# Patient Record
Sex: Male | Born: 1940 | Race: White | Hispanic: No | Marital: Married | Smoking: Former smoker
Health system: Southern US, Community
[De-identification: ages and names within clinical notes are randomized; demographics above are authoritative.]

## PROBLEM LIST (undated history)

## (undated) DIAGNOSIS — T7840XA Allergy, unspecified, initial encounter: Secondary | ICD-10-CM

## (undated) DIAGNOSIS — I10 Essential (primary) hypertension: Secondary | ICD-10-CM

## (undated) DIAGNOSIS — E785 Hyperlipidemia, unspecified: Secondary | ICD-10-CM

## (undated) DIAGNOSIS — I2510 Atherosclerotic heart disease of native coronary artery without angina pectoris: Secondary | ICD-10-CM

## (undated) DIAGNOSIS — K219 Gastro-esophageal reflux disease without esophagitis: Secondary | ICD-10-CM

## (undated) DIAGNOSIS — I509 Heart failure, unspecified: Secondary | ICD-10-CM

## (undated) DIAGNOSIS — E119 Type 2 diabetes mellitus without complications: Secondary | ICD-10-CM

## (undated) DIAGNOSIS — M1990 Unspecified osteoarthritis, unspecified site: Secondary | ICD-10-CM

## (undated) HISTORY — DX: Hyperlipidemia, unspecified: E78.5

## (undated) HISTORY — DX: Type 2 diabetes mellitus without complications: E11.9

## (undated) HISTORY — DX: Essential (primary) hypertension: I10

## (undated) HISTORY — DX: Heart failure, unspecified: I50.9

## (undated) HISTORY — DX: Atherosclerotic heart disease of native coronary artery without angina pectoris: I25.10

## (undated) HISTORY — PX: SPINE SURGERY: SHX786

## (undated) HISTORY — PX: JOINT REPLACEMENT: SHX530

## (undated) HISTORY — DX: Unspecified osteoarthritis, unspecified site: M19.90

## (undated) HISTORY — DX: Allergy, unspecified, initial encounter: T78.40XA

## (undated) HISTORY — DX: Gastro-esophageal reflux disease without esophagitis: K21.9

---

## 1997-12-27 ENCOUNTER — Ambulatory Visit (HOSPITAL_COMMUNITY): Admission: RE | Admit: 1997-12-27 | Discharge: 1997-12-27 | Payer: Self-pay | Admitting: Cardiology

## 1998-01-13 ENCOUNTER — Ambulatory Visit (HOSPITAL_COMMUNITY): Admission: RE | Admit: 1998-01-13 | Discharge: 1998-01-13 | Payer: Self-pay | Admitting: Cardiology

## 1998-01-23 ENCOUNTER — Ambulatory Visit (HOSPITAL_COMMUNITY): Admission: RE | Admit: 1998-01-23 | Discharge: 1998-01-23 | Payer: Self-pay | Admitting: Cardiology

## 1998-01-23 ENCOUNTER — Encounter: Admission: RE | Admit: 1998-01-23 | Discharge: 1998-04-23 | Payer: Self-pay | Admitting: Cardiology

## 1999-11-26 ENCOUNTER — Encounter: Payer: Self-pay | Admitting: Emergency Medicine

## 1999-11-26 ENCOUNTER — Ambulatory Visit (HOSPITAL_COMMUNITY): Admission: RE | Admit: 1999-11-26 | Discharge: 1999-11-26 | Payer: Self-pay | Admitting: Emergency Medicine

## 1999-12-21 ENCOUNTER — Encounter: Payer: Self-pay | Admitting: Neurosurgery

## 1999-12-21 ENCOUNTER — Ambulatory Visit (HOSPITAL_COMMUNITY): Admission: RE | Admit: 1999-12-21 | Discharge: 1999-12-21 | Payer: Self-pay | Admitting: Neurosurgery

## 2000-01-05 ENCOUNTER — Ambulatory Visit (HOSPITAL_COMMUNITY): Admission: RE | Admit: 2000-01-05 | Discharge: 2000-01-05 | Payer: Self-pay | Admitting: Neurosurgery

## 2000-01-05 ENCOUNTER — Encounter: Payer: Self-pay | Admitting: Neurosurgery

## 2000-01-18 ENCOUNTER — Ambulatory Visit (HOSPITAL_COMMUNITY): Admission: RE | Admit: 2000-01-18 | Discharge: 2000-01-18 | Payer: Self-pay | Admitting: Neurosurgery

## 2000-01-18 ENCOUNTER — Encounter: Payer: Self-pay | Admitting: Neurosurgery

## 2000-03-17 ENCOUNTER — Encounter: Admission: RE | Admit: 2000-03-17 | Discharge: 2000-03-17 | Payer: Self-pay | Admitting: Neurosurgery

## 2000-03-17 ENCOUNTER — Encounter: Payer: Self-pay | Admitting: Neurosurgery

## 2000-04-18 ENCOUNTER — Encounter: Admission: RE | Admit: 2000-04-18 | Discharge: 2000-04-18 | Payer: Self-pay | Admitting: Orthopaedic Surgery

## 2000-04-18 ENCOUNTER — Encounter: Payer: Self-pay | Admitting: Orthopaedic Surgery

## 2000-06-28 HISTORY — PX: OTHER SURGICAL HISTORY: SHX169

## 2001-03-08 ENCOUNTER — Ambulatory Visit (HOSPITAL_COMMUNITY): Admission: RE | Admit: 2001-03-08 | Discharge: 2001-03-09 | Payer: Self-pay | Admitting: Cardiology

## 2001-03-08 ENCOUNTER — Encounter: Payer: Self-pay | Admitting: Cardiology

## 2001-03-10 ENCOUNTER — Encounter: Payer: Self-pay | Admitting: Cardiology

## 2001-03-10 ENCOUNTER — Encounter: Admission: RE | Admit: 2001-03-10 | Discharge: 2001-03-10 | Payer: Self-pay | Admitting: Cardiology

## 2002-01-03 ENCOUNTER — Encounter: Admission: RE | Admit: 2002-01-03 | Discharge: 2002-01-03 | Payer: Self-pay | Admitting: Cardiology

## 2002-01-03 ENCOUNTER — Encounter: Payer: Self-pay | Admitting: Cardiology

## 2003-04-04 ENCOUNTER — Encounter: Payer: Self-pay | Admitting: Neurosurgery

## 2003-04-04 ENCOUNTER — Encounter: Admission: RE | Admit: 2003-04-04 | Discharge: 2003-04-04 | Payer: Self-pay | Admitting: Neurosurgery

## 2003-04-25 ENCOUNTER — Encounter: Admission: RE | Admit: 2003-04-25 | Discharge: 2003-04-25 | Payer: Self-pay | Admitting: Neurosurgery

## 2003-05-14 ENCOUNTER — Encounter: Admission: RE | Admit: 2003-05-14 | Discharge: 2003-05-14 | Payer: Self-pay | Admitting: Neurosurgery

## 2004-02-05 ENCOUNTER — Encounter: Admission: RE | Admit: 2004-02-05 | Discharge: 2004-02-05 | Payer: Self-pay | Admitting: Neurosurgery

## 2004-02-11 ENCOUNTER — Observation Stay (HOSPITAL_COMMUNITY): Admission: RE | Admit: 2004-02-11 | Discharge: 2004-02-11 | Payer: Self-pay | Admitting: Neurosurgery

## 2004-02-20 ENCOUNTER — Inpatient Hospital Stay (HOSPITAL_COMMUNITY): Admission: RE | Admit: 2004-02-20 | Discharge: 2004-02-25 | Payer: Self-pay | Admitting: Neurosurgery

## 2006-07-06 ENCOUNTER — Inpatient Hospital Stay (HOSPITAL_COMMUNITY): Admission: RE | Admit: 2006-07-06 | Discharge: 2006-07-09 | Payer: Self-pay | Admitting: Orthopedic Surgery

## 2006-07-30 ENCOUNTER — Ambulatory Visit (HOSPITAL_COMMUNITY): Admission: RE | Admit: 2006-07-30 | Discharge: 2006-07-30 | Payer: Self-pay | Admitting: Family Medicine

## 2006-07-30 ENCOUNTER — Ambulatory Visit: Payer: Self-pay | Admitting: Vascular Surgery

## 2009-01-06 ENCOUNTER — Inpatient Hospital Stay (HOSPITAL_COMMUNITY): Admission: RE | Admit: 2009-01-06 | Discharge: 2009-01-10 | Payer: Self-pay | Admitting: Orthopedic Surgery

## 2010-10-04 LAB — BASIC METABOLIC PANEL
CO2: 32 mEq/L (ref 19–32)
Calcium: 8.6 mg/dL (ref 8.4–10.5)
Calcium: 8.6 mg/dL (ref 8.4–10.5)
Chloride: 100 mEq/L (ref 96–112)
Chloride: 100 mEq/L (ref 96–112)
Chloride: 103 mEq/L (ref 96–112)
Creatinine, Ser: 0.97 mg/dL (ref 0.4–1.5)
Creatinine, Ser: 0.99 mg/dL (ref 0.4–1.5)
GFR calc Af Amer: >60 mL/min (ref >60)
GFR calc Af Amer: >60 mL/min (ref >60)
GFR calc non Af Amer: >60 mL/min (ref >60)
GFR calc non Af Amer: >60 mL/min (ref >60)
Glucose, Bld: 182 mg/dL — ABNORMAL HIGH (ref 70–99)
Potassium: 3.2 mEq/L — ABNORMAL LOW (ref 3.5–5.1)
Sodium: 141 mEq/L (ref 135–145)

## 2010-10-04 LAB — GLUCOSE, CAPILLARY
Glucose-Capillary: 224 mg/dL — ABNORMAL HIGH (ref 70–99)
Glucose-Capillary: 238 mg/dL — ABNORMAL HIGH (ref 70–99)
Glucose-Capillary: 150 mg/dL — ABNORMAL HIGH (ref 70–99)
Glucose-Capillary: 151 mg/dL — ABNORMAL HIGH (ref 70–99)
Glucose-Capillary: 160 mg/dL — ABNORMAL HIGH (ref 70–99)
Glucose-Capillary: 191 mg/dL — ABNORMAL HIGH (ref 70–99)
Glucose-Capillary: 191 mg/dL — ABNORMAL HIGH (ref 70–99)
Glucose-Capillary: 203 mg/dL — ABNORMAL HIGH (ref 70–99)
Glucose-Capillary: 203 mg/dL — ABNORMAL HIGH (ref 70–99)
Glucose-Capillary: 208 mg/dL — ABNORMAL HIGH (ref 70–99)
Glucose-Capillary: 244 mg/dL — ABNORMAL HIGH (ref 70–99)

## 2010-10-04 LAB — URINALYSIS, ROUTINE W REFLEX MICROSCOPIC
Bilirubin Urine: NEGATIVE
Glucose, UA: 250 mg/dL — AB
Hgb urine dipstick: NEGATIVE
Ketones, ur: NEGATIVE mg/dL
Nitrite: NEGATIVE
Protein, ur: NEGATIVE mg/dL
Specific Gravity, Urine: 1.005 (ref 1.005–1.030)
Urobilinogen, UA: 1 mg/dL (ref 0.0–1.0)
pH: 8 (ref 5.0–8.0)

## 2010-10-04 LAB — COMPREHENSIVE METABOLIC PANEL
ALT: 56 U/L — ABNORMAL HIGH (ref 0–53)
AST: 54 U/L — ABNORMAL HIGH (ref 0–37)
Albumin: 4.3 g/dL (ref 3.5–5.2)
Alkaline Phosphatase: 49 U/L (ref 39–117)
BUN: 10 mg/dL (ref 6–23)
CO2: 31 mEq/L (ref 19–32)
Calcium: 10 mg/dL (ref 8.4–10.5)
Chloride: 95 mEq/L — ABNORMAL LOW (ref 96–112)
Creatinine, Ser: 1 mg/dL (ref 0.4–1.5)
GFR calc Af Amer: >60 mL/min (ref >60)
GFR calc non Af Amer: >60 mL/min (ref >60)
Glucose, Bld: 259 mg/dL — ABNORMAL HIGH (ref 70–99)
Potassium: 3.3 mEq/L — ABNORMAL LOW (ref 3.5–5.1)
Sodium: 139 mEq/L (ref 135–145)
Total Bilirubin: 1.6 mg/dL — ABNORMAL HIGH (ref 0.3–1.2)
Total Protein: 7.3 g/dL (ref 6.0–8.3)

## 2010-10-04 LAB — CBC
HCT: 28.2 % — ABNORMAL LOW (ref 39.0–52.0)
HCT: 29.6 % — ABNORMAL LOW (ref 39.0–52.0)
HCT: 44 % (ref 39.0–52.0)
Hemoglobin: 14.9 g/dL (ref 13.0–17.0)
Hemoglobin: 9.7 g/dL — ABNORMAL LOW (ref 13.0–17.0)
MCHC: 33.9 g/dL (ref 30.0–36.0)
MCHC: 34.3 g/dL (ref 30.0–36.0)
MCV: 81.7 fL (ref 78.0–100.0)
MCV: 82.4 fL (ref 78.0–100.0)
MCV: 82.8 fL (ref 78.0–100.0)
Platelets: 277 10*3/uL (ref 150–400)
RBC: 3.45 MIL/uL — ABNORMAL LOW (ref 4.22–5.81)
RBC: 3.6 MIL/uL — ABNORMAL LOW (ref 4.22–5.81)
RBC: 3.78 MIL/uL — ABNORMAL LOW (ref 4.22–5.81)
RBC: 5.32 MIL/uL (ref 4.22–5.81)
RDW: 17.7 % — ABNORMAL HIGH (ref 11.5–15.5)
RDW: 18 % — ABNORMAL HIGH (ref 11.5–15.5)
WBC: 7.1 10*3/uL (ref 4.0–10.5)
WBC: 7.2 10*3/uL (ref 4.0–10.5)
WBC: 7.2 10*3/uL (ref 4.0–10.5)

## 2010-10-04 LAB — PROTIME-INR
INR: 2.9 — ABNORMAL HIGH (ref 0.00–1.49)
INR: 1.1 (ref 0.00–1.49)
INR: 1.2 (ref 0.00–1.49)
INR: 2.8 — ABNORMAL HIGH (ref 0.00–1.49)
Prothrombin Time: 32.6 seconds — ABNORMAL HIGH (ref 11.6–15.2)
Prothrombin Time: 14.3 seconds (ref 11.6–15.2)
Prothrombin Time: 15.2 seconds (ref 11.6–15.2)
Prothrombin Time: 23.3 seconds — ABNORMAL HIGH (ref 11.6–15.2)

## 2010-10-04 LAB — APTT: aPTT: 37 seconds (ref 24–37)

## 2010-10-04 LAB — TYPE AND SCREEN: Antibody Screen: NEGATIVE

## 2010-11-10 NOTE — H&P (Signed)
[name], [name]               ACCOUNT NO.:  [id number]   MEDICAL RECORD NO.:  [id number]          PATIENT TYPE:  INP   LOCATION:  NA                           FACILITY:  [facility]   PHYSICIAN:  Ollen Gross, M.D.    DATE OF BIRTH:  June 08, 1941   DATE OF ADMISSION:  DATE OF DISCHARGE:                              HISTORY & PHYSICAL   CHIEF COMPLAINT:  Right hip pain.   HISTORY OF PRESENT ILLNESS:  The patient is a 70 year old male who has  seen by Dr. Lequita Halt for ongoing right hip pain.  He has had a previous  left total hip done in January 2008. Left hip is doing well. Right hip  continues to be problematic. He has reached a point where he would like  to have something done about it. He has end-stage arthritis.  Risks and  benefits have been discussed.  He elects to proceed with surgery.   ALLERGIES:  PENICILLIN stopped his breathing.   CURRENT MEDICATIONS:  Toprol, Norvasc, potassium, metformin, Protonix,  Crestor, Lasix, Januvia, CoQ10, Aleve, occasional Vicodin.   PAST MEDICAL HISTORY:  Hypertension, coronary arterial disease,  hypercholesterolemia, reflux disease.  He has had cardiac  catheterization with coronary stenting, non-insulin-dependent diabetes  mellitus.  Please note he has had recent stress test.   SURGICAL HISTORY:  Cardiac catheterization with coronary stenting prior  to previous hip surgery. And then he had a coronary stenting back in  2002. Lumbar fusion L4-5 in August 2005, and left total replacement in  January 2008.   SOCIAL HISTORY:  Married, 3 children.  Quit smoking about 12 years ago.  Truck driver. Less than 1 drink of alcohol per week.  He does want to  look into a skilled rehab facility.  Lives in [place] home.  Does have a  living will.   FAMILY HISTORY:  Mother with history of breast cancer, diabetes, and  brain tumor.  Father history unknown.   REVIEW OF SYSTEMS:  GENERAL:  No fevers, chills, or night sweats.  NEUROLOGICAL: No seizures,  syncope, or paralysis.  RESPIRATORY: No  shortness breath, productive cough, or hemoptysis.  CARDIOVASCULAR:  No  chest pain, angina, or orthopnea.  GI: No nausea, vomiting, diarrhea, or  constipation.  GU: No dysuria, hematuria, or discharge.  MUSCULOSKELETAL:  Hip pain.   PHYSICAL EXAMINATION:  VITAL SIGNS:  Pulse 74, respirations 12, blood  pressure 128/72.  GENERAL: A 70 year old white male well-nourished, well-developed,  overweight, no acute distress, alert and cooperative.  Good historian.  HEENT: Normocephalic, atraumatic.  Pupils are round and reactive.  EOMs  intact.  NECK: Supple.  CHEST: Clear.  HEART: Regular rate and rhythm.  No murmur, S1-S2 noted.  ABDOMEN: Soft, round, slightly protuberant.  ABDOMEN:  Bowel sounds present.  RECTAL, BREAST, GENITALIA:  Not done, not pertinent to present illness.  EXTREMITIES:  Right hip flexion 90-0 internal rotation, 20 degrees  external rotation, 30 degrees abduction.   IMPRESSION:  Osteoarthritis, right hip.   PLAN:  The patient was admitted to [facility] to undergo a  right total hip replacement arthroplasty.  Surgery  will be performed by  Dr. Ollen Gross. The patient does want to look into a skilled rehab  facility since his wife is unable to care for him at this time.      [name], P.A.C.      Ollen Gross, M.D.  Electronically Signed    ALP/MEDQ  D:  01/05/2009  T:  01/05/2009  Job:  [id number]   cc:   [name], M.D.  Fax: [phone]   [name], M.D.  Fax: [phone]   Ollen Gross, M.D.  Fax: [phone]

## 2010-11-10 NOTE — Discharge Summary (Signed)
[name], [name]               ACCOUNT NO.:  [id number]   MEDICAL RECORD NO.:  [id number]          PATIENT TYPE:  INP   LOCATION:  1608                         FACILITY:  [facility]   PHYSICIAN:  Ollen Gross, M.D.    DATE OF BIRTH:  Jan 12, 1941   DATE OF ADMISSION:  01/06/2009  DATE OF DISCHARGE:  01/09/2009                               DISCHARGE SUMMARY   ADMISSION DIAGNOSES:  1. Osteoarthritis right hip.  2. Hypertension.  3. Coronary arterial disease.  4. Hypercholesterolemia.  5. Reflux disease.  6. Non-insulin-dependent diabetes mellitus.  7. Status post cardiac catheterization with coronary stenting.   DISCHARGE DIAGNOSES:  1. Osteoarthritis right hip status post right total hip replacement      arthroplasty.  2. Mild postop blood loss anemia did not require transfusion.  3. Postop hypokalemia, slowly improving.  4. Hypertension.  5. Reflux disease.  6. Non-insulin dependent diabetes mellitus.  7. Status post cardiac catheterization with coronary stenting.   PROCEDURE:  January 06, 2009 right total hip.  Surgeon, Dr. Lequita Halt,  assistant, Greggory Brandy.   ANESTHESIA:  General.   CONSULTS:  None.   BRIEF HISTORY:  [name] is a 70 year old male with end-stage  arthritis of the right hip, progressive worsening pain dysfunction,  failed non-operative management, now presents for total hip  arthroplasty.   LABORATORY DATA:  Preop CBC showed hemoglobin of 14.9, hematocrit of 44,  white cell count 7.1, platelets 277.  PT/INR 14.3, 1.1 with PTT of 37.  Chem panel on admission did show a little low potassium on preop level  of 3.3, chloride 95, glucose was elevated 259, known diabetic, did have  a slight elevation AST of 54, ALT of 56.  Preop UA was negative with  exception of positive glucose.  Serial CBCs were followed.  Hemoglobin  dropped 10.6 and 10, last noted 9.7 at 28.2 with crit.  PT/INRs  followed, Coumadin protocol.  Last PT/INR 31.6 and 2.8.  Serial B mets  were followed.  Potassium dropped from 3.3 to 3.1 back up to 3.2,  glucose came down to 182.  Serum glucose level.   X-RAYS:  A 2-view chest x-ray December 31, 2008, no active lung disease.  Right hip films December 31, 2008, progression right hip degenerative joint  disease, left total hip replacement, posterior fusion lumbar.  EKG Nov 01, 2008, poor data quality, normal sinus rhythm, right bundle branch  block, left anterior fascicular block. This is unconfirmed.   HOSPITAL COURSE:  The patient admitted to [facility], taken to  OR, underwent above-stated procedure without complication.  The patient  tolerated the procedure well, later transferred to the recovery room,  then to the orthopedic floor.  Started on PCA and p.o. analgesics, given  24 hours postop IV antibiotics.  Doing pretty well on the morning of day  1, was able to get some sleep.  Potassium was a little low, low noted  preoperatively, so we changed his home potassium around.  He was on  metformin, so that was held for 48 hours postop, started back on his  Januvia.  We had him on a PCA, which was discontinued after lunch on day  1.  Discontinued his knee immobilizer, started getting up, partial  weightbearing, 25-50%.  Hemovac drain placed at the time of surgery was  pulled without difficulty.  He was already walking about 15 feet.  We  got social work involved, as he needed to look into skilled nursing  facility.  FL-2 was signed and sent out.  They wanted to look into  [place].  By day 2, he was doing well, output was good.  Dressing change,  incision looked good.  Potassium was improving a little bit.  He was  progressing with his therapy.  He was seen on rounds of day 3.  Potassium was stable.  Incision was healing well.  He was allowed to be  transferred out at that time.   DISCHARGE/PLAN:  1. The patient discharged on January 09, 2009.  2. Discharge diagnoses, please see above.   DISCHARGE MEDS:  Current medications  include:  1. Coumadin protocol.  Please titrate the Coumadin level for target      INR between 2 and 3.  Needs to be on Coumadin for 3 weeks from the      day of surgery January 06, 2009.  2. Colace 100 mg p.o. b.i.d.  3. Januvia 100 mg daily.  4. Resume his metformin 1000 mg b.i.d.  5. Lasix 40 mg b.i.d.  6. Lopressor (metoprolol) 200 mg p.o. q.a.m.Marland Kitchen  7. Tricor 145 mg daily.  8. Crestor 10 mg daily.  9. Norvasc 10 mg daily.  10.Triamterene/hydrochlorothiazide 75/50 daily.  11.Prilosec 20 mg daily.  12.His home potassium is Klor-Con 25 mEq t.i.d.  13.Tylenol 325 one to two every 4-6 hours as needed for mild pain,      temperature or headache.  14.Dulcolax suppository per rectum p.r.n. Dulcolax tabs p.o. p.r.n.  15.Robaxin 500 mg p.o. q.6-8 hours p.r.n. spasm.  16.Percocet 5 mg 1 or 2 every 4 hours as needed for pain.   DIET:  Heart-healthy, diabetic diet.   ACTIVITIES:  Partial weightbearing, 25-50%.to the right lower extremity,  hip precautions, total hip protocol.  He may start showering; however,  do not submerge incision under water.  PT and OT for gait training,  ambulation, ADLs, total hip protocol.   FOLLOW UP:  He needs to follow-up in 2 weeks in the office with Dr.  Lequita Halt at the [facility] office of [facility].  Call the office of [phone] to arrange appointment time and follow-up  with this patient.   DISPOSITION:  [facility].   CONDITION ON DISCHARGE:  Improving.      Alexzandrew L. Perkins, P.A.C.      Ollen Gross, M.D.  Electronically Signed    ALP/MEDQ  D:  01/09/2009  T:  01/09/2009  Job:  [id number]   cc:   Madaline Savage, M.D.  Fax: [phone]   Dr. Tinnie Gens

## 2010-11-10 NOTE — Discharge Summary (Signed)
[name], [name]               ACCOUNT NO.:  [id number]   MEDICAL RECORD NO.:  [id number]          PATIENT TYPE:  INP   LOCATION:  1608                         FACILITY:  [facility]   PHYSICIAN:  Ollen Gross, M.D.    DATE OF BIRTH:  11-23-1940   DATE OF ADMISSION:  01/06/2009  DATE OF DISCHARGE:  01/10/2009                               DISCHARGE SUMMARY   ADDENDUM TO DISCHARGE SUMMARY:   ADMITTING DISCHARGE DIAGNOSES:  Please see previous discharge summary.   PROCEDURE/BRIEF HISTORY:  See previous discharge summary.   NEW LABORATORIES:  He did have a follow-up PT/INR on the last day.  PT  of 32.6 and INR 2.9.  Remaining labs in previous summary.   HOSPITAL COURSE:  The patient was originally set up for discharge on  January 09, 2009, however, due to insurance reasons, we are waiting for  insurance approval before he would go over to the skilled nursing  facility of choice.  The patient tells [place] that we received insurance  approval late yesterday evening which was too late for him to be  transferred.  He was seen on rounds on January 10, 2009, by Dr. Lequita Halt.  The patient was doing well, no complaints and was discharged out at that  time.   DISCHARGE/PLAN:  The patient is discharged on January 10, 2009.  For  discharge plan and medications, please see previously dictated discharge  summary.   DISPOSITION:  [facility].   CONDITION ON DISCHARGE:  Improved.      Alexzandrew L. Perkins, P.A.C.      Ollen Gross, M.D.  Electronically Signed    ALP/MEDQ  D:  01/10/2009  T:  01/10/2009  Job:  [id number]

## 2010-11-10 NOTE — Op Note (Signed)
[name], [name]               ACCOUNT NO.:  [id number]   MEDICAL RECORD NO.:  [id number]          PATIENT TYPE:  INP   LOCATION:  0010                         FACILITY:  [facility]   PHYSICIAN:  Ollen Gross, M.D.    DATE OF BIRTH:  05-May-1941   DATE OF PROCEDURE:  01/06/2009  DATE OF DISCHARGE:                               OPERATIVE REPORT   PREOPERATIVE DIAGNOSIS:  Osteoarthritis, right hip.   POSTOPERATIVE DIAGNOSIS:  Osteoarthritis, right hip.   PROCEDURE:  Right total hip arthroplasty.   SURGEON:  Ollen Gross, MD.   ASSISTANT:  Avel Peace, PA-C.   ANESTHESIA:  General.   ESTIMATED BLOOD LOSS:  400.   DRAIN:  Hemovac x1.   COMPLICATIONS:  None.   CONDITION:  Stable to recovery.   BRIEF CLINICAL NOTE:  [name] is a 70 year old male with end-stage  arthritis of his right hip with progressively worsening pain and  dysfunction.  He has failed nonoperative management and presents now for  right total hip arthroplasty.   PROCEDURE IN DETAIL:  After the successful administration of general  anesthetic, the patient is placed in the left lateral decubitus with the  right side up and held with a hip positioner.  Right lower extremity is  isolated from his perineum with plastic drapes and prepped and draped in  a usual sterile fashion.  A short posterolateral incision is made with a  10-blade through subcutaneous tissues to the fascia lata, which is  incised in line with the skin incision.  The sciatic nerve is palpated  and protected, and the short external rotators isolated off the femur.  Capsulotomy is performed.  The hip is dislocated and the center of  femoral head is marked.  Trial prosthesis is placed such that the center  of the trial head corresponds to the center of his native femoral head.  Osteotomy line is marked on the femoral neck and osteotomy made with an  oscillating saw.  The femoral head is then removed and the femur is  retracted anteriorly to  gain acetabular exposure.   Acetabular retractors are placed and labrum and osteophytes removed.  Acetabular reaming begins at 47 mm, coursing increments of 2 to 55 mm,  then a 56 mm pinnacle acetabular shell is placed in anatomic position  and had outstanding purchase with no need for additional dome screws.  The apex hole eliminator is placed, and the 40 mm neutral Ultamet metal  liner is placed for metal-on-metal hip replacement.   The femur is prepared to the canal finder and irrigation.  Axial reaming  is performed to 15.5 mm, proximal reaming to a 20-F, and the sleeve  machined to an extra-extra large.  The 20-F extra-extra large sleeve is  placed with a 20 x 15 stem and a 36+12 neck matching native anteversion.  The 40+0 trial head is placed and the hip is reduced with outstanding  stability.  It is full extension, full external rotation, 70 degrees  flexion, 40 degrees adduction, 90 degrees internal rotation, and 90  degrees of flexion and 70 degrees of internal rotation.  By placing the  right leg on top of the left, I felt as though the leg lengths were  equal.  The hip is then dislocated and all trials removed.  A permanent  20-F extra-extra large sleeve is placed in the proximal femur and then  the  20 x 15 stem and a 36+12 neck is placed matching native  anteversion.  The 40.0 head is placed and the hip is reduced to the same  stability parameters.  The wound is copiously irrigated with saline  solution, and then the short rotator is reattached to the femur through  drill holes.  The fascia lata is closed over a Hemovac drain with  interrupted #1 Vicryl, the subcu is closed with a #1 and then a 2-0  Vicryl and subcuticular running 4-0 Monocryl.  The incision is then  cleaned and dried, and Steri-Strips and bulky sterile dressing are  applied.  He is then placed into a knee immobilizer, awakened, and  transported to recovery in stable condition.      Ollen Gross,  M.D.  Electronically Signed     FA/MEDQ  D:  01/06/2009  T:  01/06/2009  Job:  [id number]

## 2010-11-13 NOTE — Op Note (Signed)
[name], [name]               ACCOUNT NO.:  [id number]   MEDICAL RECORD NO.:  [id number]          PATIENT TYPE:  INP   LOCATION:  0008                         FACILITY:  [facility]   PHYSICIAN:  Ollen Gross, M.D.    DATE OF BIRTH:  [birth year]   DATE OF PROCEDURE:  07/06/2006  DATE OF DISCHARGE:                               OPERATIVE REPORT   PREOPERATIVE DIAGNOSIS:  Osteoarthritis left hip post.   POSTOPERATIVE DIAGNOSIS:  Osteoarthritis left hip post.   PROCEDURE:  Left total hip arthroplasty.   SURGEON:  Ollen Gross, M.D.   ASSISTANT:  Alexzandrew L. Julien Girt, P.A.   ANESTHESIA:  General.   ESTIMATED BLOOD LOSS:  500 mL.   DRAIN:  Hemovac x1.   COMPLICATIONS:  None.   CONDITION:  Stable to recovery.   BRIEF CLINICAL NOTE:  [name] is a 70 year old male with severe end-  stage osteoarthritis of the left hip.  He has failed nonoperative  management, and presents for total hip arthroplasty.   PROCEDURE IN DETAIL:  After the successful administration of general  anesthetic, the patient was placed in the right lateral decubitus  position with the left side up, and held with the hip positioner.  The  left lower extremity is isolated from his perineum with plastic drapes;  and he is prepped and draped in the usual sterile fashion.  A short  posterolateral incision is made with the 10-blade through the  subcutaneous tissue to the level of fascia lata which is incised in line  with the skin incision.  Sciatic nerve was palpated and protected; and  short rotators were isolated off the femur.  Capsulectomy was performed,  and the hip was dislocated.  The center of the femoral head is marked;  and a trial prosthesis is placed, so that the center of the trial head  corresponds to the center of his native femoral head.  Osteotomy lines  marked on the femoral neck.  An osteotomy was made with an oscillating  saw.  The femoral head is then removed, and femur retracted  anteriorly  to gain acetabular exposure.   Acetabular retractors were placed and then the labrum and osteophytes  are removed.  Reaming starts at 47 mm coursing in increments of 2 up to  57 mm; and then a 58-mm pinnacle acetabular shell is placed in anatomic  position; and transfixed with 2 dome screws with excellent purchase. A  trial 40-mm neutral liner is placed.   Femur is prepared with the canal finder and irrigation.  Axial reaming  is performed to 15.5 mm, and proximal reaming to a 20-F, and the sleeve  machine to a large.  A 20-F large trial sleeve is placed with a 20 x 15  stem, and a 36 +12 neck, about 10 degrees beyond native anteversion.  A  trial 40 +0 head is placed, and the hip is reduced with excellent  stability.  There is still a little tiny bit of soft tissue laxity so I  went to a 40 +3 which corrected that.  Stability is fantastic with a  full extension,  full external rotation 70 degrees flexion, 40 degrees  adduction, 90 degrees internal rotation, 90 degrees of flexion, and 70  degrees of internal rotation.  By placing the left leg on top of the  right I felt as though leg lengths were equal.   The trials were removed and then the permanent apex hole eliminator was  placed into the acetabular shell.  The permanent 40-mm neutral Ultramet  liner is placed.  This is a metal-on-metal hip replacement.  On the  femoral side we placed the 20-F large sleeve with a 20 x 15 stem, 36 +12  neck, again, about 10 degrees beyond native anteversion.  The 40 +3 head  is then placed and the hip is reduced with the same stability  parameters.  Wound is copiously irrigated with saline solution; and the  short rotators are reattached to the femur through drill holes.  Fascia  lata was closed over Hemovac drain with interrupted #1 Vicryls.  Subcu  closed with #1 and then #2-0 Vicryl and subcuticular running 4-0  Monocryl.  Incisions cleaned and dried; and Steri-Strips and a bulky   sterile dressing applied.  He left lower extremity is then placed into a  knee immobilizer.  He is awakened, and transported to recovery in stable  condition.      Ollen Gross, M.D.  Electronically Signed     FA/MEDQ  D:  07/06/2006  T:  07/06/2006  Job:  [id number]

## 2010-11-13 NOTE — H&P (Signed)
NAMELAMARKUS, [name]               ACCOUNT NO.:  [id number]   MEDICAL RECORD NO.:  [id number]          PATIENT TYPE:  INP   LOCATION:  NA                           FACILITY:  [facility]   PHYSICIAN:  Ollen Gross, M.D.    DATE OF BIRTH:  1941-02-14   DATE OF ADMISSION:  07/06/2006  DATE OF DISCHARGE:                              HISTORY & PHYSICAL   CHIEF COMPLAINT:  Left hip pain.   HISTORY OF PRESENT ILLNESS:  The patient is a 70 year old male who has  been by Dr. Waylan Rocher for on-going progressive left hip pain.  It has been  progressive over the past 1/2 year starting in the groin and radiating  down to the anterior thigh.  It is getting to the point where it is hard  for him to rise from a sitting position and it is interfering with his  mobility.  He was seen in the office where x-rays showed end-stage  arthritis to the left hip with some bone-on-bone, significant erosion.  He has reached the point where he would benefit from undergoing surgical  intervention.  Risks and benefits have been discussed and he has elected  to proceed with surgery.  He does have a cardiac history and he has been  seen and has undergone a recent stress test.  The stress test showed  probable inferior scar from base of apex without significant ischemia  present.  Compared to the previous study, there is no significant change  and no significant ischemia was demonstrated, essentially on the low  risk scan.  This was read by Dr. Lenise Herald.   ALLERGIES:  PENICILLIN STOPPED BREATHING.   CURRENT MEDICATIONS:  1. Toprol.  2. Norvasc.  3. Potassium.  4. Glucophage.  5. Protonix.  6. Crestor.  7. COQ10.  8. Vitamins.  9. Aleve.  10.Tylenol fluid fill.  11.Occasional Vicodin.  12.Vitamin C.   PAST MEDICAL HISTORY:  1. Coronary arterial disease.  2. Hypertension.  3. Non-insulin dependent diabetes mellitus.  4. Reflux disease.  5. Hypercholesterolemia.   PAST SURGICAL HISTORY:  1. Cardiac  catheterization with coronary stenting for an 85% blockage.  2. Lumbar fusion L4-5 by Dr. Turner Daniels in 2005.   SOCIAL HISTORY:  Married, 3 children.  Quit tobacco about 10 years ago.  Works as a Loss adjuster, chartered.  Less than 1 drink of alcohol per week.   FAMILY HISTORY:  Mother deceased at 49 with history of breast cancer,  diabetes, brain tumor.  Father unknown history.   REVIEW OF SYSTEMS:  GENERAL:  No fevers, chills, night sweats.  NEUROLOGIC:  No seizures, syncope, paralysis.  RESPIRATORY:  No  shortness of breath, productive cough, hemoptysis.  CARDIOVASCULAR:  He  does have a significant cardiac history with previous stenting.  Denies  any angina, orthopnea or palpitations.  GI:  No nausea, vomiting,  diarrhea, constipation.  GU:  No dysuria, hematuria.  MUSCULOSKELETAL:  Left hip.   PHYSICAL EXAMINATION:  VITAL SIGNS:  Pulse 102, respirations 20, blood  pressure 130/74.  GENERAL:  A 70 year old white male, well developed, well nourished, no  acute distress.  Overweight.  Appears to be a good historian.  No acute  stress.  He is alert and oriented and cooperative.  HEENT:  Normocephalic, atraumatic.  Pupils round and reactive.  Oropharynx clear.  EOMs intact.  NECK:  Supple, no carotid bruits appreciated.  CHEST:  Clear anterior to posterior chest walls.  No wheezes, rhonchi or  rales.  HEART:  Regular rhythm, mildly distant heart sounds on exam.  ABDOMEN:  Slightly protuberant, round.  Bowel sounds present.  BREASTS:  Not done.  No pertinent to history of present illness.  EXTREMITIES:  Left hip, limited motion, only flexed to 95 degrees.  There is very minimal internal and external rotation.  Abduction at 20.  He is ambulating with somewhat of an antalgic gait.   IMPRESSION:  1. Osteoarthritis left hip.  2. Coronary arterial disease.  3. Status post cardiac catheterization with coronary stenting.  4. Hypertension.  5. Non-insulin dependent diabetes mellitus.  6. Reflux  disease.  7. Hypercholesterolemia.   PLAN:  The patient admitted to [facility] to undergo a left  total hip replacement arthroplasty.  Surgery will be performed with Dr.  Ollen Gross.  Dr. Elsie Lincoln, his cardiologist to be notified.  __________  be consulted to assist with perioperative management of this patient, if  needed, in the postoperative period.      [name], P.A.      Ollen Gross, M.D.  Electronically Signed    ALP/MEDQ  D:  07/05/2006  T:  07/06/2006  Job:  [id number]   cc:   Brett Canales A. Cleta Alberts, M.D.  Fax: [phone]   Madaline Savage, M.D.  Fax: [phone]   Ollen Gross, M.D.  Fax: [phone]

## 2010-11-13 NOTE — Op Note (Signed)
NAME:  [name], [name]                         ACCOUNT NO.:  [id number]   MEDICAL RECORD NO.:  [id number]                   PATIENT TYPE:  INP   LOCATION:  3006                                 FACILITY:  [facility]   PHYSICIAN:  Payton Doughty, M.D.                   DATE OF BIRTH:  1941/05/31   DATE OF PROCEDURE:  02/20/2004  DATE OF DISCHARGE:                                 OPERATIVE REPORT   PREOPERATIVE DIAGNOSIS:  Spondylosis, L4-5 and L5-S1.   POSTOPERATIVE DIAGNOSIS:  Spondylosis, L4-5 and L5-S1.   OPERATIVE PROCEDURE:  L4-5, L5-S1 laminectomy, diskectomy, posterior lumbar  interbody fusion with Ray threaded fusion cages, and posterolateral  arthrodesis.   ANESTHESIA:  General endotracheal.   PREPARATION:  Alcohol wipes.   COMPLICATIONS:  None.   NURSE ASSISTANCE:  [place].   DOCTOR ASSISTANCE:  Clydene Fake, M.D.   BODY OF TEXT:  A 70 year old gentleman with severe lumbar spondylosis at 4-5  and 5-1. He was taken to the operating room, smoothly anesthetized,  intubated, placed prone on the operating table. Following shave, prep, and  drape in the usual sterile fashion, skin was infiltrated with 1% lidocaine  with 1:400,000 epinephrine. Skin was incised from the middle of L3 to the  middle of S1, and the lamina and transverse processes of L4-L5 and the  sacral ala were exposed bilaterally in the subperitoneal plane.  Intraoperative x-ray confirmed correctness level.  Having confirmed  correctness level, the pars lamina and inferior facet of L4 and L5 and the  superior facet of L5 and S1 were removed bilaterally using the high speed  drill. This allowed depression of the nerve roots. At 4-5 on the right side,  there was a large herniated disk with severe compression of the right L5 and  S1 nerve roots as they traversed this area. This disk was removed resulting  in great decompression of the spinal canal. There was abundant redundant  ligamentum flavum on both sides at  both levels, and this also was removed  and decompressed the nerve roots. Ray threaded fusion cages 16 x 21 mm were  then placed at both 4-5 and 5-1 while protecting both the upper and lower  nerve roots at the involved level. Pedicle screws were then placed at L4,  L5, and S1. Intraoperative x-rays showed good placement of ray cages and  pedicle screws. The cages were backboned with a bone harvest from the facet  joints and kept. The pedicle screws were linked by 70-mm rods, and the top-  locking caps were placed. Intraoperative x-rays showed good placement of the  final construct. Bone marrow was aspirated from the right L4 pedicle and  placed on the Vitoss bone matrix, and this was used as an inner transverse  fusion. The fascia was then reapproximated with 0 Vicryl in interrupted  fashion, subcutaneous tissues were reapproximated with 0 Vicryl in  interrupted  fashion, subcuticular tissues were approximated with 3-0 Vicryl  in interrupted fashion, and skin was closed with 3-0 nylon in running  locking fashion. Betadine Telfa dressing was applied and made occlusive with  OpSite, and the patient returned to recovery room in good condition.                                               Payton Doughty, M.D.    MWR/MEDQ  D:  02/20/2004  T:  02/21/2004  Job:  [id number]

## 2010-11-13 NOTE — H&P (Signed)
NAME:  [name], [name]                         ACCOUNT NO.:  [id number]   MEDICAL RECORD NO.:  [id number]                   PATIENT TYPE:  INP   LOCATION:  3172                                 FACILITY:  [facility]   PHYSICIAN:  Payton Doughty, M.D.                   DATE OF BIRTH:  1940/07/29   DATE OF ADMISSION:  02/20/2004  DATE OF DISCHARGE:                                HISTORY & PHYSICAL   ADMITTING DIAGNOSIS:  Spondylosis, L4-5 and L5-S1.   BODY OF TEXT:  This is a 70 year old right-handed white gentleman who I have  been seeing for a few years.  He has had back pain off and on for several  years.  Over the past few months, his back pain has increased markedly.  He  has got pain going down his leg and MRI shows increased spondylosis at 4-5  and 5-1; he is admitted for a lumbar fusion.   MEDICAL HISTORY:  Medical history is remarkable for:  1. Acute onset diabetes.  2. Hypertension.   MEDICATIONS:  1. Crestor 10 mg a day.  2. Protonix 40 mg a day.  3. Toprol-XL 200 mg a day.  4. Maxzide 75/50 mg a day.  5. Metformin extended release 500 mg a day.  6. Ecotrin a day.  7. Tylenol p.r.n.  8. Aleve b.i.d.  9. Ultram 50 mg a day.   He was on Plavix, but because of some bleeding when his IVs were started a  week ago, his case was cancelled, he was taken off Plavix and aspirin, and  is now admitted for his fusion.   SURGICAL HISTORY:  Surgical history is knee arthroscopy, 2 on the left, 1 on  the right.   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   FAMILY HISTORY:  Both parents are deceased.  There is a history of MI and  diabetes.   REVIEW OF SYSTEMS:  Review of systems is remarkable for __________,  hypertension, hypercholesterolemia, leg pain while he is walking, back pain  and diabetes.   PHYSICAL EXAMINATION:  HEENT:  His HEENT exam is within normal limits.  NECK:  He has reasonable range of motion of the neck.  CHEST:  Clear.  CARDIAC EXAM:  Regular rate and rhythm with no S3.  ABDOMEN:  Abdomen is large and nontender.  EXTREMITIES:  The lower extremities have no pitting edema.  There is no  clubbing or cyanosis and his pulses are good.  NEUROLOGIC:  Neurologically, he is awake, alert and oriented.  His cranial  nerves are intact.  Motor exam showed 5/5 strength throughout the upper and  lower extremities.  Sensory deficit is described as in a right L5 and S1  distribution.  Reflexes are a flicker at the knees, absent at the ankles.  Toes are downgoing bilaterally.  He has an antalgic  gait on the left side.  Straight leg raise is positive bilaterally.   IMAGING STUDIES:  He comes accompanied with an MRI that shows severe  spondylosis at 4-5 and 5-1.   CLINICAL IMPRESSION:  Lumbar spondylosis and herniated disk with  incapacitating back pain.   PLAN:  The plan is for a lumbar laminectomy, diskectomy and posterior lumbar  interbody fusion with Ray threaded fusion cages, posterolateral arthrodesis  and pedicle screw fixation.  The risks and benefits of this approach have  been discussed with him and he wishes to proceed.                                                Payton Doughty, M.D.    MWR/MEDQ  D:  02/20/2004  T:  02/20/2004  Job:  [id number]

## 2010-11-13 NOTE — Cardiovascular Report (Signed)
[place]. [facility]  Patient:    EULOGIO, REQUENA Visit Number: [id number] MRN: [id number]          Service Type: CAT Location: 3700 [id number] [id number] Attending Physician:  Ophelia Shoulder Proc. Date: 03/08/01 Admit Date:  03/08/2001   CC:         Earl Lites, M.D., [facility], [address]  Cath Lab   Cardiac Catheterization  PROCEDURE: 1. Left coronary angiography. 2. Intracoronary artery stent deployment of the mid left anterior descending    coronary artery.  COMPLICATIONS:  None.  RESULTS:  Successful.  ENTRY SITE:  Right femoral artery.  DYE USED:  Omnipaque.  MEDICATIONS:  Heparin 5500 units intravenously.  Weight-adjusted Integrilin double bolus technique.  Versed 1 mg x 3 doses which worked well for sedation.  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  Recently, he presented to his primary care physician, Earl Lites, M.D. for a Department of Transportation physical and was referred for treadmill testing.  The patient had good exercise capacity, but did develop ST segment depressions in the anterolateral leads at a heart rate of 153 which was his symptoms limited maximal anyway due to fatigue, but no chest pain occurred. He underwent outpatient cardiac catheterization at the [facility] last week and was found to have an isolated lesion of 85% in his mid LAD.  He presents today for outpatient intervention.  DESCRIPTION OF PROCEDURE:  The guide catheter was a 7 French left Judkins 4 guide wire.  A short Patriot wire was used for balloon placement.  The initial dilatation on the vessel was a 2.75 x 10 mm cutting balloon which was deployed to 6 atmospheres of pressure.  The result showed that the lumen was felt to be much more approachable for stent crossing.  The stent used was a 3.0 x 15 mm Nir Elite stent 3.0 x 15.  Two inflations were informed to 14 atmospheres for 60  seconds.  The lesion was reduced from 85% to 0% residual with preservation of type III [place] distal flow.  No complications occurred and the patient tolerated the procedure very well. There were ST segment depressions, but no chest pain.  FINAL DIAGNOSES: 1. Successful mid left anterior descending stenosis reduced from 85% to 0%    residual with intracoronary artery stenting. 2. Silent ischemia. 3. Abnormal stress test.Attending Physician:  Ophelia Shoulder DD:  03/08/01 TD:  03/08/01 Job: [id number]

## 2010-11-13 NOTE — Discharge Summary (Signed)
NAME:  [name], [name]                         ACCOUNT NO.:  [id number]   MEDICAL RECORD NO.:  [id number]                   PATIENT TYPE:  INP   LOCATION:  3006                                 FACILITY:  [facility]   PHYSICIAN:  Hilda Lias, M.D.                DATE OF BIRTH:  [birth year]   DATE OF ADMISSION:  02/20/2004  DATE OF DISCHARGE:  02/25/2004                                 DISCHARGE SUMMARY   ADMISSION DIAGNOSIS:  L4-L5, L5-S1 fusion.   FINAL DIAGNOSIS:  L-4L5, L5-S1 fusion.   CLINICAL HISTORY:  The patient was admitted because of back pain with  radiation to both legs.  X-rays showed degenerative joint disease of the L4-  L5, L5-S1.  He was advised to have surgery by Dr. Channing Mutters.   LABORATORY DATA:  Normal.   HOSPITAL COURSE:  The patient was taken to surgery. An L4-L5 diskectomy and  fusion was done.  Today, he is ambulating.  He is feeling much better.  He  has small drainage, but he wants to go home.  He is afebrile.  His wife is  going to change the dressing.  He is going to call the office to follow up  by Dr. Channing Mutters.   DISCHARGE CONDITION:  Improving.   MEDICATIONS:  1.  Percocet.  2.  Flexeril.   DISCHARGE INSTRUCTIONS:  Activities:  Not to drive, not to do any lifting.   FOLLOWUP:  To be seen by Dr. Channing Mutters in four weeks or before if needed.                                                Hilda Lias, M.D.    EB/MEDQ  D:  02/25/2004  T:  02/26/2004  Job:  [id number]

## 2010-11-13 NOTE — Discharge Summary (Signed)
[place]  Patient:    HAU, SANOR Visit Number: [id number] MRN: [id number]          Service Type: CAT Location: 3700 [id number] Attending Physician:  Ophelia Shoulder Dictated by:   Darcella Gasman. [place], F.N.P.C. Admit Date:  03/08/2001 Discharge Date: 03/09/2001   CC:         Viviann Spare A. Cleta Alberts, M.D.   Discharge Summary  DISCHARGE DIAGNOSES: 1. Abnormal stress test. 2. Abnormal cardiac catheterization with 90% left anterior descending lesion. 3. Coronary disease with percutaneous transluminal coronary angioplasty and    stent deployment to the left anterior descending lesion. 4. Hypertension. 5. Diabetes mellitus. 6. History of gastroesophageal reflux disease. 7. Hyperlipidemia. 8. Abnormal chest x-ray.  DISCHARGE CONDITION:  Improved.  PROCEDURES:  On March 08, 2001, percutaneous transluminal coronary angioplasty and stent deployment to the left anterior descending by Madaline Savage, M.D.  DISCHARGE MEDICATIONS:  1. Enteric-coated aspirin 32 mg daily.  2. Plavix 75 mg one daily with food for one month.  3. Maxzide 75/50 one daily.  4. Toprol XL 200 mg daily.  5. Protonix 40 mg one daily for one month.  6. K-Dur 20 mEq one daily as before.  7. Glucotrol XL 5 mg one daily until you restart the Glucophage on Saturday     and then stop taking Glucotrol.  8. Lipitor 20 mg one every evening.  9. No Glucophage until Saturday and then take as usual. 10. Nitroglycerin spray as needed p.r.n. chest pain.  DISCHARGE INSTRUCTIONS: 1. No strenuous activity.  No sexual activity.  No lifting over 10 pounds for    three days and then resume regular activities. 2. Low-fat, low-salt diabetic diet. 3. Wash right groin catheterization site with soap and water. 4. Call if any bleeding, swelling, or drainage. 5. May return to work on Monday, March 13, 2001, per cardiology. 6. Follow up with Viviann Spare A. Cleta Alberts, M.D., for Department of  Transportation    clearance. 7. Follow up with Madaline Savage, M.D., on April 03, 2001, at 11:15 a.m. 8. Have x-ray done at the [facility] this week.  HISTORY OF PRESENT ILLNESS:  A 70 year old, white, married male, a known diabetic, who had a stress by Viviann Spare A. Daub, M.D., that was positive and went on to heart catheterization at the [facility].  He was found to have single vessel disease with a 90% mid lesion and stenosis of the LAD distal to his diagonal 1.  The EF was 65-70%.  It was felt that he needed to undergo PTCA and stent at [facility] in the next week. Arrangements were made.  The patient was brought to the day hospital on March 08, 2001, for cardiac catheterization.  PAST MEDICAL HISTORY:  Hypertension, diabetes mellitus not well controlled, obesity, esophageal stricture with dilatation, hyperlipidemia, and gastroesophageal reflux.  PAST SURGICAL HISTORY:  History of inguinal herniorrhaphy, arthroscopy of the knees, and tonsillectomy and adenoidectomy.  OUTPATIENT MEDICATIONS: 1. Glucophage XR 500 mg daily. 2. Lipitor 20 mg daily. 3. Toprol 200 mg daily. 4. Maxzide 75/50 mg daily. 5. Potassium 25 mg daily.  FAMILY HISTORY:  See H&P.  SOCIAL HISTORY:  See H&P.  REVIEW OF SYSTEMS:  See H&P.  ALLERGIES:  PENICILLIN.  PHYSICAL EXAMINATION AT DISCHARGE:  Blood pressure 130/70, pulse 80, respirations 20, temperature 97.6 degrees, SAO2 on room air 98%.  LUNGS:  Clear.  HEART:  S1 and S2 regular rate and rhythm without murmurs,  rubs, gallops, or click.  ABDOMEN:  Soft and nontender.  Right groin bruise, but no hematoma.  LABORATORY DATA:  Post procedure, sodium 137, potassium 3.6, BUN 10, creatinine 0.9, glucose 218.  Cardiac enzymes:  CK 87, MB negative, troponin 0.02.  RADIOLOGY:  Chest x-ray with mild bronchitic changes and normal calcifications projected over the left scapula, which may be in  the left scapula or within the soft tissues.  Recommend further evaluation with a dedicated scapular plain film.  HOSPITAL COURSE:  The patient came in as an outpatient.  He underwent PTCA and stent deployment to the LAD and tolerated the procedure well.  He was admitted to 3700 for overnight observation with no problems.  By the morning of March 09, 2001, he was ambulated without difficulty.  He was seen and discharged by Madaline Savage, M.D.  Please note that he had an abnormal chest x-ray and will have a chest x-ray done at the [facility] at the end of this week or the first of next week, which will need to be followed to ensure that he is stable or he may need a CT of the chest. Dictated by:   Darcella Gasman. [place], F.N.P.C. Attending Physician:  Ophelia Shoulder DD:  03/09/01 TD:  03/09/01 Job: [id number]

## 2010-11-13 NOTE — Discharge Summary (Signed)
[name], [name]               ACCOUNT NO.:  [id number]   MEDICAL RECORD NO.:  [id number]          PATIENT TYPE:  INP   LOCATION:  1512                         FACILITY:  [facility]   PHYSICIAN:  Ollen Gross, M.D.    DATE OF BIRTH:  1940/10/03   DATE OF ADMISSION:  07/06/2006  DATE OF DISCHARGE:  07/09/2006                               DISCHARGE SUMMARY   ADMITTING DIAGNOSES:  1. Osteoarthritis left hip.  2. Coronary arterial disease.  3. Status post cardiac catheterization with coronary stenting.  4. Hypertension.  5. Non-insulin-dependent diabetes mellitus.  6. Reflux disease.  7. Hypercholesterolemia.   DISCHARGE DIAGNOSES:  1. Osteoarthritis left hip status post left total hip arthroplasty.  2. Postoperative hypokalemia, improved.  3. Postoperative hyponatremia, improved.   PROCEDURE:  July 06, 2006:  Left total hip.  Surgeon:  Dr. Lequita Halt.  Assistant:  Avel Peace, PA-C.  Anesthesia:  General.   CONSULTS:  Cardiology, [facility], Dr. Elsie Lincoln.   BRIEF HISTORY:  Mr. Recore is a 70 year old male with severe end-stage  arthritis of the left hip who failed nonoperative management and now  presents for total hip arthroplasty.   LABORATORY DATA:  Preoperative CBC showed hemoglobin of 14.0, hematocrit  of 40.1, white cell count 8.7.  Electrolyte panel within normal limits.  Hemoglobin A1c was 6.0%.  Serial CBCs were followed.  Hemoglobin did  drop down to 10.3.  Last noted H&H was 10.0 and 27.9.  PT/PTT  preoperatively 13.4 and 35 respectively.  INR 1.0.  Serial pro times  followed.  Last noted PT/INR 28.7 and 10.6.  Serial BMETs were followed.  Electrolytes started out normal.  Potassium dropped down to 3.2, then to  2.8.  Potassium came back to 3.4.  Sodium dropped down to 134, back up  to 138.  His glucose went up to 204, back down to 168.  BUN remained  normal along with creatinine.  Preoperative UA:  Positive glucose,  otherwise negative.  Blood  group/type A positive.   EKG May 04, 2006:  Sinus rhythm, ventricular rate 86.  This was  unconfirmed.  EKG July 06, 2006:  Normal sinus rhythm, left axis  deviation, right bundle-branch block.  When compared to July 06, 2006,  no significant change since last tracing.  Confirmed by Dr. Othelia Pulling.  Left hip films preoperatively July 01, 2006:  Marked osteoarthritis  left hip with loss of joint space.  Two-view chest preoperatively  July 01, 2006:  Stable chest, no active cardiopulmonary disease.  Pelvis/hip film on July 06, 2006:  Left total hip.   HOSPITAL COURSE:  The patient was admitted to [facility],  tolerated procedure well, later transferred to recovery room and then  the orthopedic floor.  [facility] was consulted to  assist with cardiac issues with this patient.  The patient was seen by  Dr. Elsie Lincoln postoperatively, was stable from a cardiac standpoint.  EKG  was checked and cardiology followed for any cardiac issues.  On the  morning of day #1, the patient was actually doing very well.  Briefly  discussed findings during the surgery.  He had a little bit of drop in  his sodium and potassium; fluids were adjusted.  He had good urinary  output.  Started getting up out of bed with PT.  Weaned over to p.o.  medications.  He was doing well from a cardiac standpoint and his fluids  were discontinued.  By day #2 he was doing better, had been weaned over  to p.o. medications.  His potassium was a little bit lower, down to 2.8.  Placed on K-Dur for supplement replenishing.  Dressing was changed.  Incision looked good.  From a therapy standpoint, he was getting up  ambulating about 15 feet that morning, then he got up about 60 feet that  afternoon.  He was doing well from a cardiac standpoint, very well from  an orthopedic standpoint.  By the morning of day #3 he was tolerating  medications and wanting to go home.   DISCHARGE PLAN:  1.  The patient was discharged home on July 09, 2006.  2. Discharge diagnoses:  Please see above.  3. Discharge medications:  Percocet, Robaxin, Coumadin.  4. Diet:  Cardiac diet.  5. Activity:  25-50% partial weightbearing left lower extremity.  6. Followup 2 weeks.  7. Hip precautions, total hip protocol.   DISPOSITION:  Home.   CONDITION UPON DISCHARGE:  Improving.      Alexzandrew L. Julien Girt, P.A.      Ollen Gross, M.D.  Electronically Signed    ALP/MEDQ  D:  09/08/2006  T:  09/09/2006  Job:  [id number]   cc:   Brett Canales A. Cleta Alberts, M.D.  Fax: [phone]   Madaline Savage, M.D.  Fax: [phone]

## 2011-08-29 ENCOUNTER — Ambulatory Visit: Payer: Self-pay

## 2011-09-01 ENCOUNTER — Ambulatory Visit (INDEPENDENT_AMBULATORY_CARE_PROVIDER_SITE_OTHER): Payer: Self-pay | Admitting: Emergency Medicine

## 2011-09-01 ENCOUNTER — Other Ambulatory Visit: Payer: Self-pay | Admitting: Emergency Medicine

## 2011-09-01 ENCOUNTER — Ambulatory Visit: Payer: Self-pay

## 2011-09-01 ENCOUNTER — Telehealth: Payer: Self-pay | Admitting: Emergency Medicine

## 2011-09-01 ENCOUNTER — Other Ambulatory Visit: Payer: Self-pay | Admitting: Radiology

## 2011-09-01 ENCOUNTER — Ambulatory Visit
Admission: RE | Admit: 2011-09-01 | Discharge: 2011-09-01 | Disposition: A | Discharge status: Home / Self Care | Payer: Medicare Other | Source: Ambulatory Visit | Attending: Emergency Medicine | Admitting: Emergency Medicine

## 2011-09-01 DIAGNOSIS — M25521 Pain in right elbow: Secondary | ICD-10-CM

## 2011-09-01 DIAGNOSIS — E785 Hyperlipidemia, unspecified: Secondary | ICD-10-CM

## 2011-09-01 DIAGNOSIS — M7989 Other specified soft tissue disorders: Secondary | ICD-10-CM

## 2011-09-01 DIAGNOSIS — E119 Type 2 diabetes mellitus without complications: Secondary | ICD-10-CM

## 2011-09-01 DIAGNOSIS — R509 Fever, unspecified: Secondary | ICD-10-CM

## 2011-09-01 DIAGNOSIS — I10 Essential (primary) hypertension: Secondary | ICD-10-CM

## 2011-09-01 DIAGNOSIS — I2510 Atherosclerotic heart disease of native coronary artery without angina pectoris: Secondary | ICD-10-CM

## 2011-09-01 LAB — COMPREHENSIVE METABOLIC PANEL
ALT: 17 U/L (ref 0–53)
AST: 17 U/L (ref 0–37)
Albumin: 4.4 g/dL (ref 3.5–5.2)
Alkaline Phosphatase: 64 U/L (ref 39–117)
BUN: 8 mg/dL (ref 6–23)
Calcium: 9.1 mg/dL (ref 8.4–10.5)
Chloride: 97 mEq/L (ref 96–112)
Creat: 0.69 mg/dL (ref 0.50–1.35)
Potassium: 3.8 mEq/L (ref 3.5–5.3)

## 2011-09-01 LAB — POCT CBC
Granulocyte percent: 78 %G (ref 37–80)
HCT, POC: 45.6 % (ref 43.5–53.7)
Hemoglobin: 15.6 g/dL (ref 14.1–18.1)
MPV: 9 fL (ref 0–99.8)
POC Granulocyte: 5.4 (ref 2–6.9)
POC MID %: 8.3 %M (ref 0–12)
RBC: 4.92 M/uL (ref 4.69–6.13)

## 2011-09-01 LAB — POCT GLYCOSYLATED HEMOGLOBIN (HGB A1C): Hemoglobin A1C: 7.2

## 2011-09-01 LAB — LIPID PANEL
HDL: 36 mg/dL — ABNORMAL LOW (ref >39)
LDL Cholesterol: 47 mg/dL (ref 0–99)
Total CHOL/HDL Ratio: 3.6 Ratio

## 2011-09-01 LAB — GLUCOSE, POCT (MANUAL RESULT ENTRY): POC Glucose: 258

## 2011-09-01 LAB — URIC ACID: Uric Acid, Serum: 5.4 mg/dL (ref 4.0–7.8)

## 2011-09-01 MED ORDER — ROSUVASTATIN CALCIUM 20 MG PO TABS: 20.0000 mg | ORAL_TABLET | Freq: Every day | ORAL | Status: DC

## 2011-09-01 MED ORDER — POTASSIUM CHLORIDE CRYS ER 20 MEQ PO TBCR: 20.0000 meq | EXTENDED_RELEASE_TABLET | Freq: Two times a day (BID) | ORAL | Status: DC

## 2011-09-01 MED ORDER — FUROSEMIDE 40 MG PO TABS: 40.0000 mg | ORAL_TABLET | Freq: Two times a day (BID) | ORAL | Status: DC

## 2011-09-01 MED ORDER — TRIAMTERENE-HCTZ 37.5-25 MG PO TABS: 1.0000 | ORAL_TABLET | Freq: Every day | ORAL | Status: DC

## 2011-09-01 MED ORDER — SITAGLIPTIN PHOSPHATE 100 MG PO TABS: 100.0000 mg | ORAL_TABLET | Freq: Every day | ORAL | Status: DC

## 2011-09-01 MED ORDER — METFORMIN HCL ER 500 MG PO TB24: ORAL_TABLET | ORAL | Status: DC

## 2011-09-01 MED ORDER — OMEPRAZOLE 20 MG PO CPDR: 20.0000 mg | DELAYED_RELEASE_CAPSULE | Freq: Every day | ORAL | Status: DC

## 2011-09-01 MED ORDER — METOPROLOL TARTRATE 100 MG PO TABS: 100.0000 mg | ORAL_TABLET | Freq: Two times a day (BID) | ORAL | Status: DC

## 2011-09-01 MED ORDER — AMLODIPINE BESYLATE 10 MG PO TABS: 10.0000 mg | ORAL_TABLET | Freq: Every day | ORAL | Status: DC

## 2011-09-01 MED ORDER — DOXYCYCLINE HYCLATE 100 MG PO TABS: 100.0000 mg | ORAL_TABLET | Freq: Two times a day (BID) | ORAL | Status: AC

## 2011-09-01 NOTE — Telephone Encounter (Signed)
NOTIFIED PT OF RESULTS. HE WILL RTC ON Friday.

## 2011-09-01 NOTE — Telephone Encounter (Signed)
Phone call from [place] where imaging regarding results of the Doppler right upper extremity. No evidence of clot. The olecranon bursa was swollen and cystic consistent with an infection. Her please call patient and advise him of infection of his elbow . Please have him followup on Friday after 2 . Please be sure he picks up his antibiotics today and starts on them today

## 2011-09-01 NOTE — Progress Notes (Signed)
  Subjective:    Patient ID: [name], male    DOB: April 23, 1941, 71 y.o.   MRN: [id number]  HPI she enters with a chief complaint of pain and swelling of his right upper extremity. He has noticed pain and swelling over the right olecranon. This pain has progressed. He also has noted some fever. He has noticed swelling that extends down into his right hand. He also wants to recheck on his diabetes. He also needs refills on all his medications.    Review of Systems  Constitutional: Positive for fever and fatigue.  HENT: Negative.   Eyes: Negative.   Respiratory: Negative.   Cardiovascular: Negative.   Gastrointestinal: Negative.   Genitourinary: Negative.   Musculoskeletal: Positive for myalgias, joint swelling and arthralgias.       Objective:   Physical Exam  Constitutional: He appears well-developed.  HENT:  Head: Normocephalic.  Eyes: Pupils are equal, round, and reactive to light.  Neck: No tracheal deviation present. No thyromegaly present.  Cardiovascular: Normal rate and regular rhythm.   Pulmonary/Chest: Breath sounds normal. No respiratory distress. He has no wheezes. He has no rales. He exhibits no tenderness.  Abdominal: Soft. There is no tenderness.  Musculoskeletal:       Examination of the right upper extremity reveals swelling over the right hand and forearm. There is swelling over the right olecranon which is also warm. There is no tenderness in the right axillary area appeared     [facility] reading (PRIMARY) by  Dr. Cleta Alberts chest x-ray shows no acute disease.        Assessment & Plan:    Assessment is diabetes not under good control and related to patient's poor dietary and exercise capability. He hasn't appears to be an olecranon bursitis cellulitis of the right elbow. His diabetes not under good control with hemoglobin A1c of 7.2 sugar 252.Marland Kitchen He is to take 2 Aleve twice a day. We'll schedule venous Doppler the right upper extremity.

## 2011-09-02 NOTE — Progress Notes (Signed)
Spoke with patient and he did receive rx yesterday.  Disregard.

## 2011-09-02 NOTE — Progress Notes (Signed)
Please call [name] and ask him if he got his prescription for doxycycline yesterday. Please be sure he follows up with me either tomorrow between 2 and 6 or Sunday between 10 and 4.

## 2011-09-03 ENCOUNTER — Ambulatory Visit (INDEPENDENT_AMBULATORY_CARE_PROVIDER_SITE_OTHER): Payer: Medicare Other | Admitting: Emergency Medicine

## 2011-09-03 VITALS — BP 145/92 | HR 81 | Temp 99.2°F | Resp 18 | Ht 68.58 in | Wt 246.6 lb

## 2011-09-03 DIAGNOSIS — L03119 Cellulitis of unspecified part of limb: Secondary | ICD-10-CM

## 2011-09-03 DIAGNOSIS — IMO0002 Reserved for concepts with insufficient information to code with codable children: Secondary | ICD-10-CM

## 2011-09-03 DIAGNOSIS — M7020 Olecranon bursitis, unspecified elbow: Secondary | ICD-10-CM

## 2011-09-03 NOTE — Progress Notes (Signed)
  Subjective:    Patient ID: [name], male    DOB: 1940/09/05, 71 y.o.   MRN: [id number]  HPI patient seen 48 hours ago with pain and swelling over the right elbow. Patient underwent an ultrasound of the right arm which disclosed a cystic area over the right olecranon bursa. This was consistent with trauma and hemorrhage versus cellulitis. Patient has been taking doxycycline twice a day as well as keeping his arm elevated. Since his last visit here he has noticed decreased pain and swelling of the elbow .    Review of Systems     Objective:   Physical Exam physical exam reveals decreased pain to touch. there is decreased redness and swelling over the olecranon. There is swelling of the hand but it is decreased from previous.        Assessment & Plan:   Assessment is resolving olecranon bursitis cellulitis. We'll continue the doxycycline as well as ice and elevation

## 2011-09-03 NOTE — Patient Instructions (Signed)
Olecranon Bursitis  Bursitis is swelling and soreness (inflammation) of a fluid-filled sac (bursa) that covers and protects a joint. Olecranon bursitis occurs over the elbow.   CAUSES  Bursitis can be caused by injury, overuse of the joint, arthritis, or infection.   SYMPTOMS    Tenderness, swelling, warmth, or redness over the elbow.   Elbow pain with movement. This is greater with bending the elbow.   Squeaking sound when the bursa is rubbed or moved.   Increasing size of the bursa without pain or discomfort.   Fever with increasing pain and swelling if the bursa becomes infected.  HOME CARE INSTRUCTIONS    Put ice on the affected area.   Put ice in a plastic bag.   Place a towel between your skin and the bag.   Leave the ice on for 15 to 20 minutes each hour while awake. Do this for the first 2 days.   When resting, elevate your elbow above the level of your heart. This helps reduce swelling.   Continue to put the joint through a full range of motion 4 times per day. Rest the injured joint at other times. When the pain lessens, begin normal slow movements and usual activities.   Only take over-the-counter or prescription medicines for pain, discomfort, or fever as directed by your caregiver.   Reduce your intake of milk and related dairy products (cheese, yogurt). They may make your condition worse.  SEEK IMMEDIATE MEDICAL CARE IF:    Your pain increases even during treatment.   You have a fever.   You have heat and inflammation over the bursa and elbow.   You have a red line that goes up your arm.   You have pain with movement of your elbow.  MAKE SURE YOU:    Understand these instructions.   Will watch your condition.   Will get help right away if you are not doing well or get worse.  Document Released: 07/14/2006 Document Revised: 06/03/2011 Document Reviewed: 05/30/2007  ExitCare Patient Information 2012 [employer].

## 2012-01-26 ENCOUNTER — Telehealth: Payer: Self-pay

## 2012-01-26 MED ORDER — GLUCOSE BLOOD VI STRP: ORAL_STRIP | Status: DC

## 2012-01-26 NOTE — Addendum Note (Signed)
Addended by: Jacqualyn Posey on: 01/26/2012 01:14 PM   Modules accepted: Orders

## 2012-01-26 NOTE — Telephone Encounter (Signed)
The patient is requesting refill of 100 test strips for his blood sugar meter.  The patient stated that his pharmacy sent in a request over a week ago.  Please call the patient at [phone].

## 2012-01-26 NOTE — Telephone Encounter (Signed)
Rx refilled and patients wife notified.  Fax from pharmacy came over yesterday.

## 2012-01-29 ENCOUNTER — Telehealth: Payer: Self-pay

## 2012-01-29 MED ORDER — METFORMIN HCL ER 500 MG PO TB24: ORAL_TABLET | ORAL | Status: DC

## 2012-01-29 NOTE — Telephone Encounter (Signed)
Sent to pharmacy - will need an ov before more refills are given

## 2012-01-29 NOTE — Telephone Encounter (Signed)
Patient did not get refill auth for Glucophage at last visit with Dr. Cleta Alberts and is almost out. Needs someone to review chart (has been on this RX for years), authorize refill, and send to [facility].

## 2012-01-30 MED ORDER — METFORMIN HCL ER 500 MG PO TB24: ORAL_TABLET | ORAL | Status: DC

## 2012-01-30 NOTE — Telephone Encounter (Signed)
Done

## 2012-01-30 NOTE — Telephone Encounter (Signed)
Called pt advised Rx sent to pharmacy and will need

## 2012-01-30 NOTE — Telephone Encounter (Signed)
Pt states he needs a years worth of medicine, can we change?

## 2012-01-31 ENCOUNTER — Other Ambulatory Visit: Payer: Self-pay | Admitting: *Deleted

## 2012-05-12 ENCOUNTER — Ambulatory Visit: Payer: Medicare Other

## 2012-05-12 ENCOUNTER — Encounter: Payer: Self-pay | Admitting: Emergency Medicine

## 2012-05-12 ENCOUNTER — Ambulatory Visit (INDEPENDENT_AMBULATORY_CARE_PROVIDER_SITE_OTHER): Payer: Medicare Other | Admitting: Emergency Medicine

## 2012-05-12 ENCOUNTER — Other Ambulatory Visit: Payer: Self-pay | Admitting: Emergency Medicine

## 2012-05-12 ENCOUNTER — Ambulatory Visit
Admission: RE | Admit: 2012-05-12 | Discharge: 2012-05-12 | Disposition: A | Discharge status: Home / Self Care | Payer: Medicare Other | Source: Ambulatory Visit | Attending: Emergency Medicine | Admitting: Emergency Medicine

## 2012-05-12 VITALS — BP 149/79 | HR 81 | Temp 98.6°F | Resp 16 | Ht 69.0 in | Wt 243.0 lb

## 2012-05-12 DIAGNOSIS — R1900 Intra-abdominal and pelvic swelling, mass and lump, unspecified site: Secondary | ICD-10-CM

## 2012-05-12 DIAGNOSIS — R109 Unspecified abdominal pain: Secondary | ICD-10-CM

## 2012-05-12 DIAGNOSIS — E119 Type 2 diabetes mellitus without complications: Secondary | ICD-10-CM

## 2012-05-12 LAB — POCT CBC
Granulocyte percent: 81.8 %G — AB (ref 37–80)
HCT, POC: 56.2 % — AB (ref 43.5–53.7)
Hemoglobin: 18.3 g/dL — AB (ref 14.1–18.1)
MCV: 97.9 fL — AB (ref 80–97)
POC LYMPH PERCENT: 12.2 %L (ref 10–50)
RBC: 5.74 M/uL (ref 4.69–6.13)

## 2012-05-12 LAB — COMPREHENSIVE METABOLIC PANEL
ALT: 24 U/L (ref 0–53)
AST: 21 U/L (ref 0–37)
Albumin: 5.2 g/dL (ref 3.5–5.2)
Alkaline Phosphatase: 60 U/L (ref 39–117)
Calcium: 10.5 mg/dL (ref 8.4–10.5)
Chloride: 94 mEq/L — ABNORMAL LOW (ref 96–112)
Potassium: 3.9 mEq/L (ref 3.5–5.3)
Sodium: 139 mEq/L (ref 135–145)
Total Protein: 7.3 g/dL (ref 6.0–8.3)

## 2012-05-12 LAB — POCT URINALYSIS DIPSTICK
Bilirubin, UA: NEGATIVE
Leukocytes, UA: NEGATIVE
Nitrite, UA: NEGATIVE
Urobilinogen, UA: 0.2
pH, UA: 7

## 2012-05-12 LAB — POCT UA - MICROSCOPIC ONLY
Casts, Ur, LPF, POC: NEGATIVE
Crystals, Ur, HPF, POC: NEGATIVE
Yeast, UA: NEGATIVE

## 2012-05-12 LAB — BUN: BUN: 12 mg/dL (ref 6–23)

## 2012-05-12 LAB — CREATININE, SERUM: Creat: 0.6 mg/dL (ref 0.50–1.35)

## 2012-05-12 MED ORDER — IOHEXOL 300 MG/ML  SOLN
125.0000 mL | Freq: Once | INTRAMUSCULAR | Status: AC | PRN
  Administered 2012-05-12: 125 mL via INTRAVENOUS

## 2012-05-12 NOTE — Progress Notes (Signed)
Subjective:    Patient ID: [name], male    DOB: 13-Apr-1941, 71 y.o.   MRN: [id number]  HPI 71 y.o male presents today with: right side abdominal pain with swelling x 2-3 months.  When it started no swelling just localized pain on right side. Started taking Aleve to help alleviate pain. this helped subside the pain..  Then pain went away for 2-3 weeks.  Yesterday, pain reoccurred now has the swelling along with the pain.  Taking BP and checking sugar daily. Ranging in the "low 200's" UTD on colonoscopy-2 years ago. He initially was having pain on his left side and subsequently developed a bulging in his right flank and is concerned about this. This area is tender to touch.  Review of Systems     Objective:   Physical Exam patient enters with a bulging of the right lateral abdomen. This area is tender to touch. I could not feel a mass in the area .  [facility] reading (PRIMARY) by  Dr.Chirstina Haan is a rounded density left side of abdomen but there are no other abnormalities seen. There is instrumentation in both hips and of the back.  Results for orders placed in visit on 05/12/12  POCT URINALYSIS DIPSTICK      Component Value Range   Color, UA yellow     Clarity, UA clear     Glucose, UA neg     Bilirubin, UA neg     Ketones, UA neg     Spec Grav, UA 1.015     Blood, UA trace     pH, UA 7.0     Protein, UA neg     Urobilinogen, UA 0.2     Nitrite, UA neg     Leukocytes, UA Negative    POCT UA - MICROSCOPIC ONLY      Component Value Range   WBC, Ur, HPF, POC 0-1     RBC, urine, microscopic 0-1     Bacteria, U Microscopic 0-2     Mucus, UA neg     Epithelial cells, urine per micros neg     Crystals, Ur, HPF, POC neg     Casts, Ur, LPF, POC neg     Yeast, UA neg    POCT CBC      Component Value Range   WBC 12.0 (*) 4.6 - 10.2 K/uL   Lymph, poc 1.5  0.6 - 3.4   POC LYMPH PERCENT 12.2  10 - 50 %L   MID (cbc) 0.7  0 - 0.9   POC MID % 6.0  0 - 12 %M   POC Granulocyte 9.8 (*) 2 -  6.9   Granulocyte percent 81.8 (*) 37 - 80 %G   RBC 5.74  4.69 - 6.13 M/uL   Hemoglobin 18.3 (*) 14.1 - 18.1 g/dL   HCT, POC 40.9 (*) 81.1 - 53.7 %   MCV 97.9 (*) 80 - 97 fL   MCH, POC 31.9 (*) 27 - 31.2 pg   MCHC 32.6  31.8 - 35.4 g/dL   RDW, POC 91.4     Platelet Count, POC 414  142 - 424 K/uL   MPV 9.6  0 - 99.8 fL        Assessment & Plan:  Patient here with a bulging of his right flank area. I did not feel a mass in this area. We'll check a CT rule out a hernia in this area in the lateral abdominal wall. He did have a colonoscopy  done 2 years ago and was recommended to have a followup in 5-10 years. CT report showed diverticulosis with gallstones. Patient does not desire referral to a general surgeon at this time.

## 2012-05-16 ENCOUNTER — Telehealth: Payer: Self-pay

## 2012-05-16 DIAGNOSIS — K8020 Calculus of gallbladder without cholecystitis without obstruction: Secondary | ICD-10-CM

## 2012-05-16 DIAGNOSIS — R1011 Right upper quadrant pain: Secondary | ICD-10-CM

## 2012-05-16 NOTE — Telephone Encounter (Signed)
Pt is needing to talk with dr Cleta Alberts about his symptoms and the scan results -he states that the swelling is still there  Pt has the results of the scan as well   Best number [phone]

## 2012-05-17 ENCOUNTER — Telehealth (INDEPENDENT_AMBULATORY_CARE_PROVIDER_SITE_OTHER): Payer: Self-pay

## 2012-05-17 ENCOUNTER — Ambulatory Visit (INDEPENDENT_AMBULATORY_CARE_PROVIDER_SITE_OTHER): Payer: Medicare Other | Admitting: Family Medicine

## 2012-05-17 VITALS — BP 142/88 | HR 88 | Temp 98.2°F | Resp 16 | Ht 69.0 in | Wt 243.0 lb

## 2012-05-17 DIAGNOSIS — E119 Type 2 diabetes mellitus without complications: Secondary | ICD-10-CM

## 2012-05-17 DIAGNOSIS — R109 Unspecified abdominal pain: Secondary | ICD-10-CM

## 2012-05-17 DIAGNOSIS — D72829 Elevated white blood cell count, unspecified: Secondary | ICD-10-CM

## 2012-05-17 DIAGNOSIS — R1011 Right upper quadrant pain: Secondary | ICD-10-CM

## 2012-05-17 DIAGNOSIS — R935 Abnormal findings on diagnostic imaging of other abdominal regions, including retroperitoneum: Secondary | ICD-10-CM

## 2012-05-17 DIAGNOSIS — R1013 Epigastric pain: Secondary | ICD-10-CM

## 2012-05-17 LAB — POCT CBC
HCT, POC: 51.8 % (ref 43.5–53.7)
Lymph, poc: 1.4 (ref 0.6–3.4)
MCH, POC: 32.3 pg — AB (ref 27–31.2)
MCV: 98.2 fL — AB (ref 80–97)
MID (cbc): 0.5 (ref 0–0.9)
POC LYMPH PERCENT: 13.9 %L (ref 10–50)
Platelet Count, POC: 382 10*3/uL (ref 142–424)
RDW, POC: 17.4 %
WBC: 10 10*3/uL (ref 4.6–10.2)

## 2012-05-17 LAB — COMPREHENSIVE METABOLIC PANEL
AST: 23 U/L (ref 0–37)
Albumin: 4.6 g/dL (ref 3.5–5.2)
Alkaline Phosphatase: 62 U/L (ref 39–117)
BUN: 12 mg/dL (ref 6–23)
Calcium: 9.6 mg/dL (ref 8.4–10.5)
Creat: 0.86 mg/dL (ref 0.50–1.35)
Glucose, Bld: 245 mg/dL — ABNORMAL HIGH (ref 70–99)

## 2012-05-17 LAB — LIPASE: Lipase: 32 U/L (ref 0–75)

## 2012-05-17 LAB — GLUCOSE, POCT (MANUAL RESULT ENTRY): POC Glucose: 264 mg/dl — AB (ref 70–99)

## 2012-05-17 MED ORDER — HYDROCODONE-ACETAMINOPHEN 5-325 MG PO TABS: 1.0000 | ORAL_TABLET | Freq: Four times a day (QID) | ORAL | Status: DC | PRN

## 2012-05-17 NOTE — Telephone Encounter (Signed)
Spoke w/pt who agrees he needs to see a Careers adviser as soon as possible bc his discomfort has become pain at times and swelling persisits.  Per Dr Cleta Alberts, putting in order for urgent referral to [facility]. Notified pt of referral and advised him if pain worsens or if he has fever/vomiting he can go to ER and be evaluated/surgery more quickly if needed. Pt agreed.   Dr Cleta Alberts, pt asked if there is anything you could safely Rx for pain such as hydrocodone while waiting for appt? He is currently taking Aleve and it helps some but not enough. Advised pt that he can take Tylenol in between doses of Aleve if necessary and the he should avoid all high fat foods. Do you want to Rx anything else?

## 2012-05-17 NOTE — Telephone Encounter (Signed)
Left message for patient to call and speak with Sadao Weyer re: appointment for 05/19/12.  (R/S appt from 05/19/12 to 05/18/12 @ 3:00 pm w/Dr. Biagio Quint.)

## 2012-05-17 NOTE — Patient Instructions (Signed)
We will recheck your liver tests, a pancreas test, and schedule an ultrasound for this sore area. You can take the pain medicine as discussed.  You should be hearing from the surgery office in the next few days for appointment. Return to the clinic or go to the nearest emergency room if any of your symptoms worsen or new symptoms occur.

## 2012-05-17 NOTE — Telephone Encounter (Signed)
Spoke to patient regarding appointment, patient would like to wait until his [place] to come in for consultation.  Patient advised that if he has an increase in abdominal pain, RUQ pain that radiates to shoulder or back, chills, fever, nausea/vomiting or yellowing of the eye's or skin to go to the nearest emergency room for further evaluation.  Patient understood.  Patient will also call our office if he feel's he need's to be seen this week and I will change his appointment at his request back to Friday with Dr. Biagio Quint.

## 2012-05-17 NOTE — Progress Notes (Signed)
Subjective:    Patient ID: [name], male    DOB: December 06, 1940, 71 y.o.   MRN: [id number]  HPI [name] is a 71 y.o. male Hx of DM, CAD, HTN, hyperlipdemia.. R sided swelling. See last office visit (05/12/12)  with Dr. Cleta Alberts and telephone calls.  IN summary: right side abdominal pain with swelling x 2-3 months.  When it started no swelling just localized pain on right side. Started taking Aleve to help alleviate pain. this helped subside the pain..  Then pain went away for 2-3 weeks. pain reoccurred on the 14th and hadswelling along with the pain. colonoscopy-2 years ago.  Afebrile last office visit, but slight elevation of WBC: 12.0 with granulocytes 81%. Slight elevation of bilirubin.  Sent for CT abd/pelvis on 11/15: IMPRESSION:  1. No abdominal wall hernia is seen.  2. Multiple small gallstones layer within the gallbladder.  3. 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  4. Rectosigmoid colonic diverticula.   Has also been referred to [place] [facility] for eval of this. Has not received a call about this yet.   Progressively more swollen, now more painful.  Prior a nuisance.  Now more soreness in past 3 days. Increasing more sore each day. No known fever.  Feels well otherwise. Does not feel ill. Tx: alleve - 2 in am and night, and few tylenol during day. These had been helping until today - more sore. Weight 236 to 243 since last visit.   DM - had to hold metformin for 2 days, home blood sugars up to 300 with this.  Usually blood sugars 200- 240. A1c 7.2 on 09/01/11.    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  No complications. No pain into hip.   Etoh: 1 glass of wine 3-4 times per week.   Review of Systems  Constitutional: Negative for fever, chills and appetite change.  Respiratory: Negative for chest tightness and shortness of breath.   Cardiovascular: Negative for chest pain.  Gastrointestinal: Negative for nausea, vomiting, diarrhea,  constipation (last BM 20 minutes ago - normal. ), abdominal distention (no abdomen swelling, just flank/side. ) and anal bleeding.  Genitourinary: Positive for flank pain (swelling.). Negative for urgency, hematuria, decreased urine volume and difficulty urinating.  Psychiatric/Behavioral: Positive for self-injury.       Objective:   Physical Exam  Constitutional: He is oriented to person, place, and time. He appears well-developed and well-nourished.  HENT:  Head: Normocephalic and atraumatic.  Abdominal: Soft. He exhibits distension (protuberant, but baseline per patient. ). There is no hepatosplenomegaly. There is tenderness. There is no rebound, no guarding and no CVA tenderness.    Musculoskeletal: Normal range of motion.       Negative psoas sign.   Neurological: He is alert and oriented to person, place, and time.  Skin: Skin is warm and dry. No rash noted.  Psychiatric: He has a normal mood and affect. His behavior is normal.    Results for orders placed in visit on 05/17/12  POCT CBC      Component Value Range   WBC 10.0  4.6 - 10.2 K/uL   Lymph, poc 1.4  0.6 - 3.4   POC LYMPH PERCENT 13.9  10 - 50 %L   MID (cbc) 0.5  0 - 0.9   POC MID % 5.3  0 - 12 %M   POC Granulocyte 8.1 (*) 2 - 6.9   Granulocyte percent 80.8 (*) 37 - 80 %  G   RBC 5.27  4.69 - 6.13 M/uL   Hemoglobin 17.0  14.1 - 18.1 g/dL   HCT, POC 16.1  09.6 - 53.7 %   MCV 98.2 (*) 80 - 97 fL   MCH, POC 32.3 (*) 27 - 31.2 pg   MCHC 32.8  31.8 - 35.4 g/dL   RDW, POC 04.5     Platelet Count, POC 382  142 - 424 K/uL   MPV 9.4  0 - 99.8 fL  GLUCOSE, POCT (MANUAL RESULT ENTRY)      Component Value Range   POC Glucose 264 (*) 70 - 99 mg/dl  .    Assessment & Plan:  [name] is a 71 y.o. male  1. Abdominal pain  POCT CBC, Comprehensive metabolic panel, HYDROcodone-acetaminophen (NORCO/VICODIN) 5-325 MG per tablet, US Abdomen Complete  2. Epigastric pain  POCT CBC, Comprehensive metabolic panel,  HYDROcodone-acetaminophen (NORCO/VICODIN) 5-325 MG per tablet, US Abdomen Complete  3. Leukocytosis    4. Diabetes mellitus  Lipase, POCT glucose (manual entry)  5. RUQ pain  US Abdomen Complete    R flank pain/swelling - soft tissue likely, but had gallstones on CT.  Unlikely diverticulitis with improving leukocytosis, and afebrile.  Will recheck CMP, schedule abd/pelvic ultrasound, lortab if needed, and has surgical eval pending.  Discussed with Dr. Cleta Alberts. Rtc/er precations discussed with patient.   Patient Instructions  We will recheck your liver tests, a pancreas test, and schedule an ultrasound for this sore area. You can take the pain medicine as discussed.  You should be hearing from the surgery office in the next few days for appointment. Return to the clinic or go to the nearest emergency room if any of your symptoms worsen or new symptoms occur.

## 2012-05-17 NOTE — Telephone Encounter (Signed)
Dr Cleta Alberts, do you want to call the pt, or have me call him w/instr's?

## 2012-05-17 NOTE — Telephone Encounter (Signed)
Please call glands and tell him I would like him to let me go ahead and make him an appointment to discuss with the surgeon the symptoms he is having on the right side of his abdomen as well as the presence of his multiple gallstones at least we can get his opinion regarding the discomfort he is having and the swelling he is having.

## 2012-05-17 NOTE — Telephone Encounter (Signed)
If anything is Rxd, pt requests it to be sent to [facility].

## 2012-05-18 ENCOUNTER — Ambulatory Visit
Admission: RE | Admit: 2012-05-18 | Discharge: 2012-05-18 | Disposition: A | Discharge status: Home / Self Care | Payer: Medicare Other | Source: Ambulatory Visit | Attending: Family Medicine | Admitting: Family Medicine

## 2012-05-18 DIAGNOSIS — R1013 Epigastric pain: Secondary | ICD-10-CM

## 2012-05-18 DIAGNOSIS — R109 Unspecified abdominal pain: Secondary | ICD-10-CM

## 2012-05-18 DIAGNOSIS — R1011 Right upper quadrant pain: Secondary | ICD-10-CM

## 2012-05-18 NOTE — Telephone Encounter (Signed)
Patient was called back and he was advised to come in and he was seen and checked by Dr. Meredith Staggers.

## 2012-05-19 ENCOUNTER — Ambulatory Visit (INDEPENDENT_AMBULATORY_CARE_PROVIDER_SITE_OTHER): Payer: Self-pay | Admitting: General Surgery

## 2012-05-22 ENCOUNTER — Telehealth: Payer: Self-pay | Admitting: Radiology

## 2012-05-22 ENCOUNTER — Telehealth: Payer: Self-pay | Admitting: Emergency Medicine

## 2012-05-22 ENCOUNTER — Other Ambulatory Visit: Payer: Self-pay | Admitting: Emergency Medicine

## 2012-05-22 NOTE — Telephone Encounter (Signed)
Additional labs added per Dr Cleta Alberts. MRI scan ordered for him, have left message for Dr Magnus Ivan to call Dr Cleta Alberts back about this patient, Dr Cleta Alberts cell # provided.

## 2012-05-22 NOTE — Telephone Encounter (Signed)
Patient has appt with the Surgeon at [facility] next week, is c/o increased pain now. Hydrocodone eases some but has to take 2 at a time. I told him I will ask Dr Cleta Alberts and call him back.

## 2012-05-22 NOTE — Addendum Note (Signed)
Addended byCaffie Damme on: 05/22/2012 02:07 PM   Modules accepted: Orders

## 2012-05-22 NOTE — Telephone Encounter (Signed)
I spoke to Dr Cleta Alberts, he called [facility] and the appt with surgeon has been moved up. Patient can go tomorrow at 920 to see Dr Magnus Ivan (appt is 950) I called patient to advise.

## 2012-05-22 NOTE — Telephone Encounter (Signed)
Error

## 2012-05-23 ENCOUNTER — Telehealth: Payer: Self-pay | Admitting: Emergency Medicine

## 2012-05-23 ENCOUNTER — Ambulatory Visit (INDEPENDENT_AMBULATORY_CARE_PROVIDER_SITE_OTHER): Payer: Medicare Other | Admitting: Surgery

## 2012-05-23 ENCOUNTER — Encounter (INDEPENDENT_AMBULATORY_CARE_PROVIDER_SITE_OTHER): Payer: Self-pay | Admitting: Surgery

## 2012-05-23 VITALS — BP 134/82 | HR 85 | Temp 97.7°F | Ht 70.0 in | Wt 236.4 lb

## 2012-05-23 DIAGNOSIS — R109 Unspecified abdominal pain: Secondary | ICD-10-CM

## 2012-05-23 NOTE — Telephone Encounter (Signed)
Checked w/Donna who reports that MRI is scheduled for 05/29/12 but has not been approved yet. She is going to bring me information to call ins to provide more info if needed.

## 2012-05-23 NOTE — Progress Notes (Signed)
Patient ID: [name], male   DOB: 1941/05/28, 71 y.o.   MRN: [id number]  Chief Complaint  Patient presents with  . Other    gallstones and liver mass    HPI [name] is a 71 y.o. male.   HPI This is a pleasant gentleman referred by Dr. Cleta Alberts for evaluation of right-sided abdominal pain with right flank swelling. This has been occurring with the pain for several months. The swelling is more recent. He has pain into the back. He has no nausea or vomiting. This is unrelated to fatty foods. The pain as sharp and intermittent. He has no other complaints. He denies any weight loss or weight gain Past Medical History  Diagnosis Date  . Allergy   . Arthritis   . Diabetes mellitus without complication   . Hypertension   . CHF (congestive heart failure)   . GERD (gastroesophageal reflux disease)   . Hyperlipidemia     Past Surgical History  Procedure Date  . Joint replacement   . Spine surgery   . Heart stent 2002    Family History  Problem Relation Age of Onset  . Cancer Mother     breast    Social History History  Substance Use Topics  . Smoking status: Former Smoker -- 2.0 packs/day for 15 years    Types: Cigarettes    Quit date: 08/31/2001  . Smokeless tobacco: Never Used  . Alcohol Use: Yes     Comment: occ    Allergies  Allergen Reactions  . Penicillins Shortness Of Breath    Current Outpatient Prescriptions  Medication Sig Dispense Refill  . amLODipine (NORVASC) 10 MG tablet Take 1 tablet (10 mg total) by mouth daily.  90 tablet  3  . ferrous sulfate 325 (65 FE) MG tablet Take 325 mg by mouth 2 (two) times daily.      . furosemide (LASIX) 40 MG tablet Take 1 tablet (40 mg total) by mouth 2 (two) times daily.  180 tablet  3  . glucose blood test strip Use as instructed, once or twice daily  100 each  1  . HYDROcodone-acetaminophen (NORCO/VICODIN) 5-325 MG per tablet Take 1 tablet by mouth every 6 (six) hours as needed for pain.  20 tablet  0  .  metFORMIN (GLUCOPHAGE-XR) 500 MG 24 hr tablet Take 2 pills (1000mg ) twice daily  360 tablet  3  . metoprolol (LOPRESSOR) 100 MG tablet Take 1 tablet (100 mg total) by mouth 2 (two) times daily.  180 tablet  3  . omeprazole (PRILOSEC) 20 MG capsule Take 1 capsule (20 mg total) by mouth daily.  90 capsule  3  . potassium chloride SA (K-DUR,KLOR-CON) 20 MEQ tablet Take 1 tablet (20 mEq total) by mouth 2 (two) times daily.  180 tablet  3  . rosuvastatin (CRESTOR) 20 MG tablet Take 1 tablet (20 mg total) by mouth daily.  90 tablet  3  . sitaGLIPtin (JANUVIA) 100 MG tablet Take 1 tablet (100 mg total) by mouth daily.  90 tablet  3  . triamterene-hydrochlorothiazide (MAXZIDE-25) 37.5-25 MG per tablet Take 1 each (1 tablet total) by mouth daily.  90 tablet  3    Review of Systems Review of Systems  Constitutional: Negative for fever, chills and unexpected weight change.  HENT: Negative for hearing loss, congestion, sore throat, trouble swallowing and voice change.   Eyes: Negative for visual disturbance.  Respiratory: Negative for cough and wheezing.   Cardiovascular: Negative for chest  pain, palpitations and leg swelling.  Gastrointestinal: Positive for abdominal pain and abdominal distention. Negative for nausea, vomiting, diarrhea, constipation, blood in stool, anal bleeding and rectal pain.  Genitourinary: Negative for hematuria and difficulty urinating.  Musculoskeletal: Positive for back pain. Negative for arthralgias.  Skin: Negative for rash and wound.  Neurological: Negative for seizures, syncope, weakness and headaches.  Hematological: Negative for adenopathy. Does not bruise/bleed easily.  Psychiatric/Behavioral: Negative for confusion.    Blood pressure 134/82, pulse 85, temperature 97.7 F (36.5 C), temperature source Temporal, height 5\' 10"  (1.778 m), weight 236 lb 6.4 oz (107.23 kg), SpO2 97.00%.  Physical Exam Physical Exam  Constitutional: He is oriented to person, place, and  time. He appears well-developed and well-nourished. No distress.  HENT:  Head: Normocephalic and atraumatic.  Right Ear: External ear normal.  Left Ear: External ear normal.  Nose: Nose normal.  Mouth/Throat: Oropharynx is clear and moist. No oropharyngeal exudate.  Eyes: Conjunctivae normal are normal. Pupils are equal, round, and reactive to light. Right eye exhibits no discharge. Left eye exhibits no discharge. No scleral icterus.  Neck: Normal range of motion. Neck supple. No tracheal deviation present. No thyromegaly present.  Cardiovascular: Normal rate, regular rhythm, normal heart sounds and intact distal pulses.   No murmur heard. Pulmonary/Chest: Effort normal and breath sounds normal. No respiratory distress. He has no wheezes.  Abdominal: Soft. Bowel sounds are normal. There is tenderness. There is guarding.       Abdomen is morbidly obese. He is tender in the right upper quadrant and right flank. There is right flank swelling but no obvious hernia. He does have a reducible left inguinal hernia. There is also abnormal appearing skin lesion at the umbilicus  Musculoskeletal: Normal range of motion. He exhibits no edema and no tenderness.  Lymphadenopathy:    He has no cervical adenopathy.  Neurological: He is alert and oriented to person, place, and time.  Skin: Skin is warm and dry. He is not diaphoretic. No erythema.  Psychiatric: His behavior is normal. Judgment normal.    Data Reviewed I have reviewed the CAT scan of her abdomen and pelvis as well as the ultrasound. He does have multiple gallstones with no evidence of cholecystitis. There is no obvious ventral hernia. There is an abnormal 2.5 centimeter lesion in the middle right lobe of the liver.  Assessment    Right flank/right upper quadrant abdominal pain of uncertain etiology    Plan    I am uncertain of the cause of his pain and swelling. He does have gallstones, but his symptoms are atypical. Again, he did not  have an obvious hernia. An MRI of his abdomen is pending to evaluate the liver lesion. I will see him back after this to determine whether a laparoscopy and possible cholecystectomy is necessary. Given the location, I wonder if this could be secondary to something going on his back with a nerve distribution type of discomfort.       Malai Lady A 05/23/2012, 9:36 AM

## 2012-05-23 NOTE — Telephone Encounter (Signed)
Please check and be sure  the MRI of his abdomen is being scheduled.

## 2012-05-24 ENCOUNTER — Ambulatory Visit (INDEPENDENT_AMBULATORY_CARE_PROVIDER_SITE_OTHER): Payer: Self-pay | Admitting: General Surgery

## 2012-05-24 NOTE — Telephone Encounter (Signed)
I called and provided more clin info for ins and received approval # [id number]. Notified Dr Cleta Alberts and Referrals.

## 2012-05-29 ENCOUNTER — Ambulatory Visit
Admission: RE | Admit: 2012-05-29 | Discharge: 2012-05-29 | Disposition: A | Discharge status: Home / Self Care | Payer: Medicare Other | Source: Ambulatory Visit | Attending: Emergency Medicine | Admitting: Emergency Medicine

## 2012-05-29 ENCOUNTER — Telehealth (INDEPENDENT_AMBULATORY_CARE_PROVIDER_SITE_OTHER): Payer: Self-pay

## 2012-05-29 DIAGNOSIS — R1011 Right upper quadrant pain: Secondary | ICD-10-CM

## 2012-05-29 DIAGNOSIS — R109 Unspecified abdominal pain: Secondary | ICD-10-CM

## 2012-05-29 DIAGNOSIS — R935 Abnormal findings on diagnostic imaging of other abdominal regions, including retroperitoneum: Secondary | ICD-10-CM

## 2012-05-29 MED ORDER — GADOXETATE DISODIUM 0.25 MMOL/ML IV SOLN
10.0000 mL | Freq: Once | INTRAVENOUS | Status: AC | PRN
  Administered 2012-05-29: 10 mL via INTRAVENOUS

## 2012-05-29 NOTE — Telephone Encounter (Signed)
Pt has been seen for abdominal and flank pain.  He is scheduled for an MRI today, but is having severe discomfort and would like pain medication.  Please advise.

## 2012-05-29 NOTE — Telephone Encounter (Signed)
Needs to call primary care physician

## 2012-05-29 NOTE — Telephone Encounter (Signed)
LMOV per Dr. Magnus Ivan he needs to contact his PCP for refill of his pain medication.

## 2012-05-30 ENCOUNTER — Telehealth: Payer: Self-pay

## 2012-05-30 ENCOUNTER — Other Ambulatory Visit: Payer: Self-pay | Admitting: Family Medicine

## 2012-05-30 DIAGNOSIS — R109 Unspecified abdominal pain: Secondary | ICD-10-CM

## 2012-05-30 DIAGNOSIS — R1013 Epigastric pain: Secondary | ICD-10-CM

## 2012-05-30 MED ORDER — HYDROCODONE-ACETAMINOPHEN 5-325 MG PO TABS: 1.0000 | ORAL_TABLET | Freq: Four times a day (QID) | ORAL | Status: DC | PRN

## 2012-05-30 NOTE — Telephone Encounter (Signed)
Called in Norco 5/325 1 po q6 hrs prn #20 0 refills to [facility]. Patient notified and voiced understanding.

## 2012-05-30 NOTE — Telephone Encounter (Signed)
Okay to refill on hydrocodone.

## 2012-05-30 NOTE — Telephone Encounter (Signed)
Patient is requesting a refill for hydrocodone. His appt with Dr. Magnus Ivan is not until next Tuesday 06/06/12.

## 2012-05-30 NOTE — Telephone Encounter (Signed)
PT STATES HE CAN'T GET AN APPT WITH THE REFERRING DR Tanna Savoy NEXT WEEK AND IN THE MEANTIME IS OUT OF HIS HYDROCODONE AND HE IS IN DESPERATE NEED OF MORE PLEASE CALL [phone]     [facility]

## 2012-06-01 ENCOUNTER — Telehealth: Payer: Self-pay | Admitting: Emergency Medicine

## 2012-06-01 NOTE — Telephone Encounter (Signed)
Called and gave results of ultrasound. MRI being scheduled. Appt tomorrow at [facility]. AFP ordered. GI Dr. ic Dr. Elnoria Howard. No fever,vomiting or change in bowels.Appetite is good.

## 2012-06-01 NOTE — Telephone Encounter (Signed)
Erroneous encounter

## 2012-06-06 ENCOUNTER — Encounter (INDEPENDENT_AMBULATORY_CARE_PROVIDER_SITE_OTHER): Payer: Self-pay | Admitting: Surgery

## 2012-06-06 ENCOUNTER — Ambulatory Visit (INDEPENDENT_AMBULATORY_CARE_PROVIDER_SITE_OTHER): Payer: Medicare Other | Admitting: Surgery

## 2012-06-06 VITALS — BP 138/80 | HR 81 | Temp 98.0°F | Resp 20 | Ht 70.0 in | Wt 238.2 lb

## 2012-06-06 DIAGNOSIS — R109 Unspecified abdominal pain: Secondary | ICD-10-CM

## 2012-06-06 NOTE — Progress Notes (Signed)
Subjective:     Patient ID: [name], male   DOB: 05-07-1941, 71 y.o.   MRN: [id number]  HPI He is here for a followup of his MRI of the abdomen. He still reports occasional bloating on his right flank and right flank pain. It is unrelated to fatty meals. He has no nausea or vomiting.  Review of Systems     Objective:   Physical Exam On exam, there is a superficial abdominal wall tenderness with no guarding. There are no hernias.The MRI showed that the lesion in the liver was consistent with a hemangioma and benign. Again, he incidentally has gallstones. There is no evidence of a hernia either on the CAT scan or MRI   Assessment:     Right-sided abdominal pain of uncertain etiology    Plan:     I discussed the findings with him. Again, I am uncertain of the cause of his discomfort. I offered cholecystectomy although his symptoms are not  Totally consistent with this. He declined. I will see him back as needed

## 2012-06-09 ENCOUNTER — Encounter: Payer: Self-pay | Admitting: Emergency Medicine

## 2012-06-09 ENCOUNTER — Ambulatory Visit (INDEPENDENT_AMBULATORY_CARE_PROVIDER_SITE_OTHER): Payer: Medicare Other | Admitting: Emergency Medicine

## 2012-06-09 VITALS — BP 138/78 | HR 80 | Temp 98.6°F | Resp 18 | Ht 70.0 in | Wt 240.0 lb

## 2012-06-09 DIAGNOSIS — K8020 Calculus of gallbladder without cholecystitis without obstruction: Secondary | ICD-10-CM | POA: Insufficient documentation

## 2012-06-09 DIAGNOSIS — M549 Dorsalgia, unspecified: Secondary | ICD-10-CM

## 2012-06-09 DIAGNOSIS — K769 Liver disease, unspecified: Secondary | ICD-10-CM

## 2012-06-09 DIAGNOSIS — R1013 Epigastric pain: Secondary | ICD-10-CM

## 2012-06-09 DIAGNOSIS — R109 Unspecified abdominal pain: Secondary | ICD-10-CM

## 2012-06-09 DIAGNOSIS — R160 Hepatomegaly, not elsewhere classified: Secondary | ICD-10-CM | POA: Insufficient documentation

## 2012-06-09 LAB — POCT CBC
HCT, POC: 51.2 % (ref 43.5–53.7)
Lymph, poc: 1.2 (ref 0.6–3.4)
MCHC: 33.2 g/dL (ref 31.8–35.4)
MCV: 96.5 fL (ref 80–97)
MID (cbc): 0.5 (ref 0–0.9)
POC Granulocyte: 7.2 — AB (ref 2–6.9)
POC LYMPH PERCENT: 13.6 %L (ref 10–50)
Platelet Count, POC: 372 10*3/uL (ref 142–424)
RDW, POC: 17.3 %

## 2012-06-09 LAB — GLUCOSE, POCT (MANUAL RESULT ENTRY): POC Glucose: 204 mg/dl — AB (ref 70–99)

## 2012-06-09 MED ORDER — HYDROCODONE-ACETAMINOPHEN 5-325 MG PO TABS: ORAL_TABLET | ORAL | Status: DC

## 2012-06-09 NOTE — Progress Notes (Signed)
  Subjective:    Patient ID: [name], male    DOB: 08/11/1940, 71 y.o.   MRN: [id number]  HPI issue here to follow up and pain in his right side and a sensation of swelling right side of his abdomen. He is overall doing well. 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 He is known to have gallbladder disease and has been to the surgeon who is hesitant to take out his gallbladder since he does not have typical gallbladder symptoms.    Review of Systems     Objective:   Physical Exam Plan looks good today he does not appear in any distress. There is no tenderness in the right upper abdomen and right flank area. There are no masses felt in this area. There is a bulging of the right  Lateral abdomen  Results for orders placed in visit on 06/09/12  POCT CBC      Component Value Range   WBC 8.9  4.6 - 10.2 K/uL   Lymph, poc 1.2  0.6 - 3.4   POC LYMPH PERCENT 13.6  10 - 50 %L   MID (cbc) 0.5  0 - 0.9   POC MID % 5.9  0 - 12 %M   POC Granulocyte 7.2 (*) 2 - 6.9   Granulocyte percent 80.5 (*) 37 - 80 %G   RBC 5.31  4.69 - 6.13 M/uL   Hemoglobin 17.0  14.1 - 18.1 g/dL   HCT, POC 86.5  78.4 - 53.7 %   MCV 96.5  80 - 97 fL   MCH, POC 32.0 (*) 27 - 31.2 pg   MCHC 33.2  31.8 - 35.4 g/dL   RDW, POC 69.6     Platelet Count, POC 372  142 - 424 K/uL   MPV 9.5  0 - 99.8 fL  GLUCOSE, POCT (MANUAL RESULT ENTRY)      Component Value Range   POC Glucose 204 (*) 70 - 99 mg/dl       Assessment & Plan:  Patient being referred to Dr. Audley Hose for his opinion regarding the abnormal area on his liver as well as gallbladder situation

## 2012-08-12 ENCOUNTER — Other Ambulatory Visit: Payer: Self-pay | Admitting: Physician Assistant

## 2012-08-12 NOTE — Telephone Encounter (Signed)
PATIENT REQUESTS IF DR. DAUB CAN REFILL THESES TWO RX'S: 1) TEST STRIP ULTRA HAS EXPIRED AND NEEDS TO BE REFILLED FOR A YEAR 2) HYDROCODONE NEEDS TO BE REFILLED [EVEN THOUGH IT IS TOO EARLY-PHARMACY DENIED REFILL] BECAUSE HE SAYS HIS SON IS A PARAMEDIC AND HAS TAKEN SOME, SO THE PATIENT HAS NONE LEFT. HE WAS WONDERING IF DR. DAUB OR ANOTHER PROVIDER COULD LOOK AT THESE TO REFILL. [facility] [address]

## 2012-08-15 NOTE — Telephone Encounter (Signed)
Pt is waiting for his refill on strips ([facility]_   [phone]

## 2012-08-29 ENCOUNTER — Telehealth: Payer: Self-pay | Admitting: *Deleted

## 2012-08-29 NOTE — Telephone Encounter (Signed)
This message was taken on 08/13/11 it looks like he got his test strips.  I have not seen any messages since this message. Just thought I would send this to you.  Adela Glimpse at 08/12/2012 10:35 AM    Status: Signed            PATIENT REQUESTS IF DR. DAUB CAN REFILL THESES TWO RX'S: 1) TEST STRIP ULTRA HAS EXPIRED AND NEEDS TO BE REFILLED FOR A YEAR 2) HYDROCODONE NEEDS TO BE REFILLED [EVEN THOUGH IT IS TOO EARLY-PHARMACY DENIED REFILL] BECAUSE HE SAYS HIS SON IS A PARAMEDIC AND HAS TAKEN SOME, SO THE PATIENT HAS NONE LEFT. HE WAS WONDERING IF DR. DAUB OR ANOTHER PROVIDER COULD LOOK AT THESE TO REFILL. [facility] [address].

## 2012-08-29 NOTE — Telephone Encounter (Signed)
We cannot refill his pain med early.It is against the law to give a prescription narcotic to someone else.OK to send in a year supply of test strips.

## 2012-08-30 NOTE — Telephone Encounter (Signed)
Called in test strips.

## 2012-10-07 ENCOUNTER — Ambulatory Visit (INDEPENDENT_AMBULATORY_CARE_PROVIDER_SITE_OTHER): Payer: Medicare Other | Admitting: Emergency Medicine

## 2012-10-07 VITALS — BP 166/76 | HR 98 | Temp 98.5°F | Resp 20 | Ht 70.0 in | Wt 244.0 lb

## 2012-10-07 DIAGNOSIS — I25810 Atherosclerosis of coronary artery bypass graft(s) without angina pectoris: Secondary | ICD-10-CM

## 2012-10-07 DIAGNOSIS — R1013 Epigastric pain: Secondary | ICD-10-CM

## 2012-10-07 DIAGNOSIS — D509 Iron deficiency anemia, unspecified: Secondary | ICD-10-CM

## 2012-10-07 DIAGNOSIS — R109 Unspecified abdominal pain: Secondary | ICD-10-CM

## 2012-10-07 DIAGNOSIS — D649 Anemia, unspecified: Secondary | ICD-10-CM

## 2012-10-07 DIAGNOSIS — K219 Gastro-esophageal reflux disease without esophagitis: Secondary | ICD-10-CM

## 2012-10-07 DIAGNOSIS — E119 Type 2 diabetes mellitus without complications: Secondary | ICD-10-CM

## 2012-10-07 DIAGNOSIS — I10 Essential (primary) hypertension: Secondary | ICD-10-CM

## 2012-10-07 DIAGNOSIS — E7800 Pure hypercholesterolemia, unspecified: Secondary | ICD-10-CM

## 2012-10-07 DIAGNOSIS — Z125 Encounter for screening for malignant neoplasm of prostate: Secondary | ICD-10-CM

## 2012-10-07 DIAGNOSIS — M549 Dorsalgia, unspecified: Secondary | ICD-10-CM

## 2012-10-07 LAB — COMPREHENSIVE METABOLIC PANEL
BUN: 13 mg/dL (ref 6–23)
CO2: 31 mEq/L (ref 19–32)
Calcium: 9.2 mg/dL (ref 8.4–10.5)
Chloride: 95 mEq/L — ABNORMAL LOW (ref 96–112)
Creat: 0.84 mg/dL (ref 0.50–1.35)

## 2012-10-07 LAB — POCT GLYCOSYLATED HEMOGLOBIN (HGB A1C): Hemoglobin A1C: 7.8

## 2012-10-07 LAB — POCT CBC
Granulocyte percent: 84.6 %G — AB (ref 37–80)
MID (cbc): 0.3 (ref 0–0.9)
MPV: 9 fL (ref 0–99.8)
POC Granulocyte: 6.5 (ref 2–6.9)
POC MID %: 4 %M (ref 0–12)
Platelet Count, POC: 371 10*3/uL (ref 142–424)
RBC: 5.29 M/uL (ref 4.69–6.13)

## 2012-10-07 LAB — IFOBT (OCCULT BLOOD): IFOBT: NEGATIVE

## 2012-10-07 LAB — LIPID PANEL
Cholesterol: 115 mg/dL (ref 0–200)
HDL: 33 mg/dL — ABNORMAL LOW (ref >39)
Total CHOL/HDL Ratio: 3.5 Ratio

## 2012-10-07 LAB — IBC PANEL: %SAT: 6 % — ABNORMAL LOW (ref 20–55)

## 2012-10-07 LAB — GLUCOSE, POCT (MANUAL RESULT ENTRY): POC Glucose: 281 mg/dl — AB (ref 70–99)

## 2012-10-07 MED ORDER — HYDROCODONE-ACETAMINOPHEN 5-325 MG PO TABS: ORAL_TABLET | ORAL | Status: DC

## 2012-10-07 MED ORDER — AMLODIPINE BESYLATE 10 MG PO TABS: 10.0000 mg | ORAL_TABLET | Freq: Every day | ORAL | Status: DC

## 2012-10-07 MED ORDER — FERROUS SULFATE 325 (65 FE) MG PO TABS: 325.0000 mg | ORAL_TABLET | Freq: Two times a day (BID) | ORAL | Status: DC

## 2012-10-07 MED ORDER — TRIAMTERENE-HCTZ 37.5-25 MG PO TABS: 1.0000 | ORAL_TABLET | Freq: Every day | ORAL | Status: DC

## 2012-10-07 MED ORDER — FUROSEMIDE 40 MG PO TABS: 40.0000 mg | ORAL_TABLET | Freq: Two times a day (BID) | ORAL | Status: DC

## 2012-10-07 MED ORDER — GLUCOSE BLOOD VI STRP: ORAL_STRIP | Status: DC

## 2012-10-07 MED ORDER — METFORMIN HCL ER 500 MG PO TB24: ORAL_TABLET | ORAL | Status: DC

## 2012-10-07 MED ORDER — SITAGLIPTIN PHOSPHATE 100 MG PO TABS: 100.0000 mg | ORAL_TABLET | Freq: Every day | ORAL | Status: DC

## 2012-10-07 MED ORDER — OMEPRAZOLE 20 MG PO CPDR: 20.0000 mg | DELAYED_RELEASE_CAPSULE | Freq: Every day | ORAL | Status: DC

## 2012-10-07 MED ORDER — METOPROLOL TARTRATE 100 MG PO TABS: 100.0000 mg | ORAL_TABLET | Freq: Two times a day (BID) | ORAL | Status: DC

## 2012-10-07 MED ORDER — ROSUVASTATIN CALCIUM 20 MG PO TABS: 20.0000 mg | ORAL_TABLET | Freq: Every day | ORAL | Status: DC

## 2012-10-07 MED ORDER — POTASSIUM CHLORIDE CRYS ER 20 MEQ PO TBCR: 20.0000 meq | EXTENDED_RELEASE_TABLET | Freq: Two times a day (BID) | ORAL | Status: DC

## 2012-10-07 NOTE — Progress Notes (Signed)
  Subjective:    Patient ID: [name], male    DOB: December 30, 1940, 72 y.o.   MRN: [id number]  HPI  72 year old  Caucasian male presents for follow up of his diabetes and coronary artery disease. He has significant arthritis with limited mobility. He has had persistent right sided abdominal pain but this has been essentially unchanged from previous.     Review of Systems     Objective:   Physical Exam HEENT exam TMs are tall. Neck is supple. Chest is clear to auscultation and percussion cardiac reveals a rapid rate without murmurs  Results for orders placed in visit on 10/07/12  POCT CBC      Result Value Range   WBC 7.7  4.6 - 10.2 K/uL   Lymph, poc 0.9  0.6 - 3.4   POC LYMPH PERCENT 11.4  10 - 50 %L   MID (cbc) 0.3  0 - 0.9   POC MID % 4.0  0 - 12 %M   POC Granulocyte 6.5  2 - 6.9   Granulocyte percent 84.6 (*) 37 - 80 %G   RBC 5.29  4.69 - 6.13 M/uL   Hemoglobin 12.9 (*) 14.1 - 18.1 g/dL   HCT, POC 78.2 (*) 95.6 - 53.7 %   MCV 78.8 (*) 80 - 97 fL   MCH, POC 24.4 (*) 27 - 31.2 pg   MCHC 30.9 (*) 31.8 - 35.4 g/dL   RDW, POC 21.3     Platelet Count, POC 371  142 - 424 K/uL   MPV 9.0  0 - 99.8 fL  GLUCOSE, POCT (MANUAL RESULT ENTRY)      Result Value Range   POC Glucose 281 (*) 70 - 99 mg/dl  POCT GLYCOSYLATED HEMOGLOBIN (HGB A1C)      Result Value Range   Hemoglobin A1C 7.8     Results for orders placed in visit on 10/07/12  POCT CBC      Result Value Range   WBC 7.7  4.6 - 10.2 K/uL   Lymph, poc 0.9  0.6 - 3.4   POC LYMPH PERCENT 11.4  10 - 50 %L   MID (cbc) 0.3  0 - 0.9   POC MID % 4.0  0 - 12 %M   POC Granulocyte 6.5  2 - 6.9   Granulocyte percent 84.6 (*) 37 - 80 %G   RBC 5.29  4.69 - 6.13 M/uL   Hemoglobin 12.9 (*) 14.1 - 18.1 g/dL   HCT, POC 08.6 (*) 57.8 - 53.7 %   MCV 78.8 (*) 80 - 97 fL   MCH, POC 24.4 (*) 27 - 31.2 pg   MCHC 30.9 (*) 31.8 - 35.4 g/dL   RDW, POC 46.9     Platelet Count, POC 371  142 - 424 K/uL   MPV 9.0  0 - 99.8 fL  GLUCOSE, POCT  (MANUAL RESULT ENTRY)      Result Value Range   POC Glucose 281 (*) 70 - 99 mg/dl  POCT GLYCOSYLATED HEMOGLOBIN (HGB A1C)      Result Value Range   Hemoglobin A1C 7.8    IFOBT (OCCULT BLOOD)      Result Value Range   IFOBT Negative         Assessment & Plan:  Routine labs to be done today all meds to be reorderred.

## 2012-10-14 ENCOUNTER — Other Ambulatory Visit: Payer: Self-pay | Admitting: Emergency Medicine

## 2012-10-16 ENCOUNTER — Telehealth: Payer: Self-pay

## 2012-10-16 DIAGNOSIS — E119 Type 2 diabetes mellitus without complications: Secondary | ICD-10-CM

## 2012-10-16 DIAGNOSIS — I25810 Atherosclerosis of coronary artery bypass graft(s) without angina pectoris: Secondary | ICD-10-CM

## 2012-10-16 MED ORDER — METFORMIN HCL ER 500 MG PO TB24: ORAL_TABLET | ORAL | Status: DC

## 2012-10-16 MED ORDER — POTASSIUM CHLORIDE CRYS ER 20 MEQ PO TBCR: 20.0000 meq | EXTENDED_RELEASE_TABLET | Freq: Two times a day (BID) | ORAL | Status: DC

## 2012-10-16 NOTE — Telephone Encounter (Signed)
Pt states [facility] told him their request to fill pts metformin and potassium rx were denied. Looks in the pts chart as if they were sent along with a number of other rxs.   Pt requests a call to [facility] to straighten the issue out. Pt only has a days supply of potassium left and is ok on metformin.  Best: [phone]  bf

## 2012-10-16 NOTE — Telephone Encounter (Signed)
These were sent, does he need me to send to local pharmacy until he gets mail order, he states he does not. He just needed me to resubmit to mail order this is done.

## 2012-10-21 ENCOUNTER — Telehealth: Payer: Self-pay

## 2012-10-21 DIAGNOSIS — I25810 Atherosclerosis of coronary artery bypass graft(s) without angina pectoris: Secondary | ICD-10-CM

## 2012-10-21 NOTE — Telephone Encounter (Signed)
Patient was needing this Rx: metoprolol (LOPRESSOR) 100 MG tablet refilled; says that his pharmacy is having difficulty getting all of the medication? Could someone please help him; He was crying over the phone wanting to speak to someone clinical- no one was available at the moment.   [phone]

## 2012-10-22 MED ORDER — METOPROLOL TARTRATE 100 MG PO TABS: 100.0000 mg | ORAL_TABLET | Freq: Two times a day (BID) | ORAL | Status: DC

## 2012-10-22 NOTE — Telephone Encounter (Signed)
Please call [facility]. [facility] [place] down and then go ahead and print out a 90 day prescription we can send to his mail order.

## 2012-10-22 NOTE — Telephone Encounter (Signed)
Returned call and mail order has messed up shipment. They sent out different shipments for meds. They say never received RX for Metoprolol. He only has 3 days left so is wanting rx faxed to [facility] for [phone] and a 30 day supply [facility] [address] ([place] since he will not receive mail order before runs out. Please advise. I did not pend orders since they will have to be done separately.

## 2012-10-22 NOTE — Telephone Encounter (Signed)
rx sent to [facility] and faxed to [facility]. Patient notified.

## 2012-10-23 ENCOUNTER — Other Ambulatory Visit: Payer: Self-pay

## 2012-10-23 DIAGNOSIS — I25810 Atherosclerosis of coronary artery bypass graft(s) without angina pectoris: Secondary | ICD-10-CM

## 2012-10-23 MED ORDER — METOPROLOL TARTRATE 100 MG PO TABS: 100.0000 mg | ORAL_TABLET | Freq: Two times a day (BID) | ORAL | Status: DC

## 2012-11-21 ENCOUNTER — Ambulatory Visit (INDEPENDENT_AMBULATORY_CARE_PROVIDER_SITE_OTHER): Payer: Medicare Other | Admitting: Cardiovascular Disease

## 2012-11-21 ENCOUNTER — Encounter: Payer: Self-pay | Admitting: Cardiovascular Disease

## 2012-11-21 VITALS — BP 124/74 | HR 80 | Ht 70.0 in | Wt 249.4 lb

## 2012-11-21 DIAGNOSIS — I10 Essential (primary) hypertension: Secondary | ICD-10-CM

## 2012-11-21 DIAGNOSIS — I2510 Atherosclerotic heart disease of native coronary artery without angina pectoris: Secondary | ICD-10-CM

## 2012-11-21 MED ORDER — AMLODIPINE BESYLATE 5 MG PO TABS: 5.0000 mg | ORAL_TABLET | Freq: Every day | ORAL | Status: DC

## 2012-11-21 MED ORDER — LISINOPRIL 10 MG PO TABS: 10.0000 mg | ORAL_TABLET | Freq: Every day | ORAL | Status: DC

## 2012-11-21 NOTE — Progress Notes (Signed)
[name] Date of Birth  01-18-1941       [facility] Office [address]  [address] [place], [place]  [address]   [place], [place]  [address] [phone]     [phone]   Fax  [phone]    Fax [phone]  Problem List: 1. CAD - 3.0 x 15 mm Nir Elite stent 3.0 x 15 to mid LAD Eric Chick, MD) 2. Hypertension 3. Diabetes mellitus 4. RBBB 5. Bilateral hip replacement 6. Bilateral knee arthroscopic surgeries 7. Back surgeries.    History of Present Illness:  [name] is a 72 yo with hx of CAD - previous stent in 2002 by Dr. Elsie Lincoln.   Eric has had yearly checkups and has not had any problems.  He has not complaints.   He has a RBBB on ECG but otherwise no complaints.    He does not get any regular exercise.   He worked for 35 years for [employer].    Current Outpatient Prescriptions on File Prior to Visit  Medication Sig Dispense Refill  . amLODipine (NORVASC) 10 MG tablet Take 1 tablet (10 mg total) by mouth daily.  90 tablet  3  . ferrous sulfate 325 (65 FE) MG tablet Take 1 tablet (325 mg total) by mouth 2 (two) times daily.  180 tablet  3  . furosemide (LASIX) 40 MG tablet Take 1 tablet (40 mg total) by mouth 2 (two) times daily.  180 tablet  3  . glucose blood (ONE TOUCH ULTRA TEST) test strip USE AS INSTRUCTED, ONCE OR TWICE DAILY  100 each  0  . HYDROcodone-acetaminophen (NORCO/VICODIN) 5-325 MG per tablet Take one tablet at night as needed for pain  30 tablet  5  . metFORMIN (GLUCOPHAGE-XR) 500 MG 24 hr tablet Take 2 pills (1000mg ) twice daily  360 tablet  3  . metoprolol (LOPRESSOR) 100 MG tablet Take 1 tablet (100 mg total) by mouth 2 (two) times daily.  180 tablet  3  . omeprazole (PRILOSEC) 20 MG capsule Take 1 capsule (20 mg total) by mouth daily.  90 capsule  3  . potassium chloride SA (K-DUR,KLOR-CON) 20 MEQ tablet Take 1 tablet (20 mEq total) by mouth 2 (two) times daily.  180 tablet  3  . rosuvastatin  (CRESTOR) 20 MG tablet Take 1 tablet (20 mg total) by mouth daily.  90 tablet  3  . sitaGLIPtin (JANUVIA) 100 MG tablet Take 1 tablet (100 mg total) by mouth daily.  90 tablet  3  . triamterene-hydrochlorothiazide (MAXZIDE-25) 37.5-25 MG per tablet Take 1 each (1 tablet total) by mouth daily.  90 tablet  3   No current facility-administered medications on file prior to visit.    Allergies  Allergen Reactions  . Penicillins Shortness Of Breath    Past Medical History  Diagnosis Date  . Allergy   . Arthritis   . Diabetes mellitus without complication   . Hypertension   . CHF (congestive heart failure)   . GERD (gastroesophageal reflux disease)   . Hyperlipidemia     Past Surgical History  Procedure Laterality Date  . Joint replacement    . Spine surgery    . Heart stent  2002    History  Smoking status  . Former Smoker -- 2.00 packs/day for 15 years  . Types: Cigarettes  . Quit date: 08/31/2001  Smokeless tobacco  . Never Used  History  Alcohol Use  . Yes    Comment: occ    Family History  Problem Relation Age of Onset  . Cancer Mother     breast    Reviw of Systems:  Reviewed in the HPI.  All other systems are negative.  Physical Exam: Blood pressure 124/74, pulse 80, height 5\' 10"  (1.778 m), weight 249 lb 6.4 oz (113.127 kg). General: Well developed, well nourished, in no acute distress.  Head: Normocephalic, atraumatic, sclera non-icteric, mucus membranes are moist,   Neck: Supple. Carotids are 2 + without bruits. No JVD   Lungs: Clear   Heart: RR, normal S1, S2  Abdomen: Soft, non-tender, non-distended with normal bowel sounds.  Msk:  Strength and tone are normal   Extremities: No clubbing or cyanosis. No edema.  Distal pedal pulses are 2+ and equal    Neuro: CN II - XII intact.  Alert and oriented X 3.   Psych:  Normal   ECG: May  27th 2014: Normal sinus rhythm at 80 beats a minute. He has a right bundle branch block. His left axis  deviation.  Assessment / Plan:

## 2012-11-21 NOTE — Assessment & Plan Note (Addendum)
[name] is doing quite well. He has a history of stenting in the past by Dr. Lavonne Chick. He's not had any episodes of chest pain, shortness breath, syncope, or presyncope. At this point I do not think he needs any specific testing. He is completely asymptomatic. Will have him see his general medical Dr. It will be important for him to continue with aggressive lipid lowering therapy.  I've not made him an appointment to see me but will get to see him if needed.

## 2012-11-21 NOTE — Patient Instructions (Addendum)
Lounge doctor leg rest. [email or web address] Your physician recommends that you schedule a follow-up appointment in:  AS NEEDED BASIS  Your physician has recommended you make the following change in your medication:  REDUCE AMLODIPINE TO 5 MG DAILY START LISINOPRIL 10 MG DAILY THIS WAS SENT TO YOUR PHARMACY.   PLEASE FOLLOW UP WITH YOUR PRIMARY 2-4 WEEKS BECAUSE YOU WILL NEED A LAB DRAW/ BMET TO CHECK YOUR POTASSIUM DUE TO THE MED CHANGE   REDUCE HIGH SODIUM FOODS LIKE CANNED SOUP, GRAVY, SAUCES, READY PREPARED FOODS LIKE FROZEN FOODS; LEAN CUISINE, LASAGNA. BACON, SAUSAGE, LUNCH MEAT, FAST FOODS..CHIPS AND HOT DOGS  DASH Diet The DASH diet stands for "Dietary Approaches to Stop Hypertension." It is a healthy eating plan that has been shown to reduce high blood pressure (hypertension) in as little as 14 days, while also possibly providing other significant health benefits. These other health benefits include reducing the risk of breast cancer after menopause and reducing the risk of type 2 diabetes, heart disease, colon cancer, and stroke. Health benefits also include weight loss and slowing kidney failure in patients with chronic kidney disease.  DIET GUIDELINES  Limit salt (sodium). Your diet should contain less than 1500 mg of sodium daily.  Limit refined or processed carbohydrates. Your diet should include mostly whole grains. Desserts and added sugars should be used sparingly.  Include small amounts of heart-healthy fats. These types of fats include nuts, oils, and tub margarine. Limit saturated and trans fats. These fats have been shown to be harmful in the body. CHOOSING FOODS  The following food groups are based on a 2000 calorie diet. See your Registered Dietitian for individual calorie needs. Grains and Grain Products (6 to 8 servings daily)  Eat More Often: Whole-wheat bread, brown rice, whole-grain or wheat pasta, quinoa, popcorn without added fat or salt (air  popped).  Eat Less Often: White bread, white pasta, white rice, cornbread. Vegetables (4 to 5 servings daily)  Eat More Often: Fresh, frozen, and canned vegetables. Vegetables may be raw, steamed, roasted, or grilled with a minimal amount of fat.  Eat Less Often/Avoid: Creamed or fried vegetables. Vegetables in a cheese sauce. Fruit (4 to 5 servings daily)  Eat More Often: All fresh, canned (in natural juice), or frozen fruits. Dried fruits without added sugar. One hundred percent fruit juice ( cup [237 mL] daily).  Eat Less Often: Dried fruits with added sugar. Canned fruit in light or heavy syrup. [employer], Fish, and Poultry (2 servings or less daily. One serving is 3 to 4 oz [85-114 g]).  Eat More Often: Ninety percent or leaner ground beef, tenderloin, sirloin. Round cuts of beef, chicken breast, [place] breast. All fish. Grill, bake, or broil your meat. Nothing should be fried.  Eat Less Often/Avoid: Fatty cuts of meat, [place], or chicken leg, thigh, or wing. Fried cuts of meat or fish. Dairy (2 to 3 servings)  Eat More Often: Low-fat or fat-free milk, low-fat plain or light yogurt, reduced-fat or part-skim cheese.  Eat Less Often/Avoid: Milk (whole, 2%).Whole milk yogurt. Full-fat cheeses. Nuts, Seeds, and Legumes (4 to 5 servings per week)  Eat More Often: All without added salt.  Eat Less Often/Avoid: Salted nuts and seeds, canned beans with added salt. Fats and Sweets (limited)  Eat More Often: Vegetable oils, tub margarines without trans fats, sugar-free gelatin. Mayonnaise and salad dressings.  Eat Less Often/Avoid: Coconut oils, palm oils, butter, stick margarine, cream, half and half, cookies, candy, pie. FOR MORE INFORMATION [employer] Sites  Diet Eating Plan: www.dashdiet.org Document Released: 06/03/2011 Document Revised: 09/06/2011 Document Reviewed: 06/03/2011 [facility] Patient Information 2014 [place], [place].

## 2012-12-10 ENCOUNTER — Other Ambulatory Visit: Payer: Self-pay | Admitting: Emergency Medicine

## 2013-07-19 ENCOUNTER — Ambulatory Visit (INDEPENDENT_AMBULATORY_CARE_PROVIDER_SITE_OTHER): Payer: Medicare HMO | Admitting: Emergency Medicine

## 2013-07-19 VITALS — BP 138/82 | HR 86 | Temp 98.2°F | Resp 18 | Ht 70.0 in | Wt 249.0 lb

## 2013-07-19 DIAGNOSIS — D649 Anemia, unspecified: Secondary | ICD-10-CM

## 2013-07-19 DIAGNOSIS — E785 Hyperlipidemia, unspecified: Secondary | ICD-10-CM

## 2013-07-19 DIAGNOSIS — I10 Essential (primary) hypertension: Secondary | ICD-10-CM

## 2013-07-19 DIAGNOSIS — I2510 Atherosclerotic heart disease of native coronary artery without angina pectoris: Secondary | ICD-10-CM

## 2013-07-19 DIAGNOSIS — E119 Type 2 diabetes mellitus without complications: Secondary | ICD-10-CM

## 2013-07-19 DIAGNOSIS — I25810 Atherosclerosis of coronary artery bypass graft(s) without angina pectoris: Secondary | ICD-10-CM

## 2013-07-19 DIAGNOSIS — K219 Gastro-esophageal reflux disease without esophagitis: Secondary | ICD-10-CM

## 2013-07-19 DIAGNOSIS — L988 Other specified disorders of the skin and subcutaneous tissue: Secondary | ICD-10-CM

## 2013-07-19 LAB — POCT CBC
GRANULOCYTE PERCENT: 87.4 % — AB (ref 37–80)
HCT, POC: 49.3 % (ref 43.5–53.7)
HEMOGLOBIN: 16.1 g/dL (ref 14.1–18.1)
Lymph, poc: 0.9 (ref 0.6–3.4)
MCH: 32.9 pg — AB (ref 27–31.2)
MCHC: 32.7 g/dL (ref 31.8–35.4)
MCV: 100.6 fL — AB (ref 80–97)
MID (CBC): 0.5 (ref 0–0.9)
MPV: 8.8 fL (ref 0–99.8)
PLATELET COUNT, POC: 331 10*3/uL (ref 142–424)
POC Granulocyte: 9.7 — AB (ref 2–6.9)
POC LYMPH PERCENT: 7.9 %L — AB (ref 10–50)
POC MID %: 4.7 % (ref 0–12)
RBC: 4.9 M/uL (ref 4.69–6.13)
RDW, POC: 17.4 %
WBC: 11.1 10*3/uL — AB (ref 4.6–10.2)

## 2013-07-19 LAB — COMPLETE METABOLIC PANEL WITH GFR
ALK PHOS: 66 U/L (ref 39–117)
ALT: 14 U/L (ref 0–53)
AST: 15 U/L (ref 0–37)
Albumin: 3.9 g/dL (ref 3.5–5.2)
BILIRUBIN TOTAL: 1.5 mg/dL — AB (ref 0.3–1.2)
BUN: 15 mg/dL (ref 6–23)
CALCIUM: 9.3 mg/dL (ref 8.4–10.5)
CO2: 34 mEq/L — ABNORMAL HIGH (ref 19–32)
Chloride: 96 mEq/L (ref 96–112)
Creat: 0.81 mg/dL (ref 0.50–1.35)
GLUCOSE: 231 mg/dL — AB (ref 70–99)
POTASSIUM: 4.2 meq/L (ref 3.5–5.3)
SODIUM: 141 meq/L (ref 135–145)
TOTAL PROTEIN: 6.6 g/dL (ref 6.0–8.3)

## 2013-07-19 LAB — MICROALBUMIN, URINE: Microalb, Ur: 34.54 mg/dL — ABNORMAL HIGH (ref 0.00–1.89)

## 2013-07-19 LAB — POCT GLYCOSYLATED HEMOGLOBIN (HGB A1C): HEMOGLOBIN A1C: 6.1

## 2013-07-19 LAB — LIPID PANEL
CHOLESTEROL: 120 mg/dL (ref 0–200)
HDL: 38 mg/dL — ABNORMAL LOW (ref >39)
LDL Cholesterol: 29 mg/dL (ref 0–99)
TRIGLYCERIDES: 263 mg/dL — AB (ref <150)
Total CHOL/HDL Ratio: 3.2 Ratio
VLDL: 53 mg/dL — ABNORMAL HIGH (ref 0–40)

## 2013-07-19 LAB — GLUCOSE, POCT (MANUAL RESULT ENTRY): POC Glucose: 240 mg/dl — AB (ref 70–99)

## 2013-07-19 MED ORDER — ROSUVASTATIN CALCIUM 20 MG PO TABS: 20.0000 mg | ORAL_TABLET | Freq: Every day | ORAL | Status: DC

## 2013-07-19 MED ORDER — OMEPRAZOLE 20 MG PO CPDR: 20.0000 mg | DELAYED_RELEASE_CAPSULE | Freq: Every day | ORAL | Status: DC

## 2013-07-19 MED ORDER — TRIAMTERENE-HCTZ 37.5-25 MG PO TABS: 1.0000 | ORAL_TABLET | Freq: Every day | ORAL | Status: DC

## 2013-07-19 MED ORDER — METFORMIN HCL ER 500 MG PO TB24: ORAL_TABLET | ORAL | Status: DC

## 2013-07-19 MED ORDER — POTASSIUM CHLORIDE CRYS ER 20 MEQ PO TBCR: 20.0000 meq | EXTENDED_RELEASE_TABLET | Freq: Two times a day (BID) | ORAL | Status: DC

## 2013-07-19 MED ORDER — SITAGLIPTIN PHOSPHATE 100 MG PO TABS: 100.0000 mg | ORAL_TABLET | Freq: Every day | ORAL | Status: DC

## 2013-07-19 MED ORDER — METOPROLOL TARTRATE 100 MG PO TABS: 100.0000 mg | ORAL_TABLET | Freq: Two times a day (BID) | ORAL | Status: DC

## 2013-07-19 MED ORDER — AMLODIPINE BESYLATE 5 MG PO TABS: 5.0000 mg | ORAL_TABLET | Freq: Every day | ORAL | Status: DC

## 2013-07-19 MED ORDER — FUROSEMIDE 40 MG PO TABS: 40.0000 mg | ORAL_TABLET | Freq: Two times a day (BID) | ORAL | Status: DC

## 2013-07-19 MED ORDER — LISINOPRIL 10 MG PO TABS: 10.0000 mg | ORAL_TABLET | Freq: Every day | ORAL | Status: DC

## 2013-07-19 MED ORDER — FERROUS SULFATE 325 (65 FE) MG PO TABS: 325.0000 mg | ORAL_TABLET | Freq: Two times a day (BID) | ORAL | Status: DC

## 2013-07-19 NOTE — Progress Notes (Signed)
Subjective:    Patient ID: [name], male    DOB: 10-18-1940, 73 y.o.   MRN: [id number]  HPI This chart was scribed for Remo Lipps Hugo Lybrand-MD, by Lovena Le Day, Scribe. This patient was seen in room 10 and the patient's care was started at 8:03 AM.  HPI Comments: [name] is a 73 y.o. male who presents to the [facility] complaining for lab work and prescription refill. He states that he has been doing well with his current medicines (amlodipine and lisinopril), which have helped him w/controlling his blood sugar (he reports that his blood sugars have been running in the low 100s). He states that he has been improving with his diet but has not lost any weight since his last visit.   Patient Active Problem List   Diagnosis Date Noted  . Gallstones 06/09/2012  . Liver mass 06/09/2012  . Right sided abdominal pain 05/23/2012  . Diabetes mellitus 09/01/2011  . CAD (coronary artery disease) 09/01/2011  . Hypertension 09/01/2011  . Hyperlipidemia 09/01/2011   Past Surgical History  Procedure Laterality Date  . Joint replacement    . Spine surgery    . Heart stent  2002   Family History  Problem Relation Age of Onset  . Cancer Mother     breast   History   Social History  . Marital Status: Married    Spouse Name: N/A    Number of Children: N/A  . Years of Education: N/A   Occupational History  . Not on file.   Social History Main Topics  . Smoking status: Former Smoker -- 2.00 packs/day for 15 years    Types: Cigarettes    Quit date: 08/31/2001  . Smokeless tobacco: Never Used  . Alcohol Use: Yes     Comment: occ  . Drug Use: No  . Sexual Activity: Not on file   Other Topics Concern  . Not on file   Social History Narrative  . No narrative on file    Allergies  Allergen Reactions  . Penicillins Shortness Of Breath   Results for orders placed in visit on 10/07/12  COMPREHENSIVE METABOLIC PANEL      Result Value Range   Sodium 137  135  - 145 mEq/L   Potassium 3.8  3.5 - 5.3 mEq/L   Chloride 95 (*) 96 - 112 mEq/L   CO2 31  19 - 32 mEq/L   Glucose, Bld 249 (*) 70 - 99 mg/dL   BUN 13  6 - 23 mg/dL   Creat 0.84  0.50 - 1.35 mg/dL   Total Bilirubin 1.4 (*) 0.3 - 1.2 mg/dL   Alkaline Phosphatase 54  39 - 117 U/L   AST 15  0 - 37 U/L   ALT 17  0 - 53 U/L   Total Protein 6.5  6.0 - 8.3 g/dL   Albumin 4.3  3.5 - 5.2 g/dL   Calcium 9.2  8.4 - 10.5 mg/dL  LIPID PANEL      Result Value Range   Cholesterol 115  0 - 200 mg/dL   Triglycerides 240 (*) <150 mg/dL   HDL 33 (*) >39 mg/dL   Total CHOL/HDL Ratio 3.5     VLDL 48 (*) 0 - 40 mg/dL   LDL Cholesterol 34  0 - 99 mg/dL  PSA      Result Value Range   PSA 1.27  <=4.00 ng/mL  FERRITIN      Result Value Range  Ferritin 7 (*) 22 - 322 ng/mL  IBC PANEL      Result Value Range   UIBC 407 (*) 125 - 400 ug/dL   TIBC 435  215 - 435 ug/dL   %SAT 6 (*) 20 - 55 %  IRON      Result Value Range   Iron 28 (*) 42 - 165 ug/dL  POCT CBC      Result Value Range   WBC 7.7  4.6 - 10.2 K/uL   Lymph, poc 0.9  0.6 - 3.4   POC LYMPH PERCENT 11.4  10 - 50 %L   MID (cbc) 0.3  0 - 0.9   POC MID % 4.0  0 - 12 %M   POC Granulocyte 6.5  2 - 6.9   Granulocyte percent 84.6 (*) 37 - 80 %G   RBC 5.29  4.69 - 6.13 M/uL   Hemoglobin 12.9 (*) 14.1 - 18.1 g/dL   HCT, POC 41.7 (*) 43.5 - 53.7 %   MCV 78.8 (*) 80 - 97 fL   MCH, POC 24.4 (*) 27 - 31.2 pg   MCHC 30.9 (*) 31.8 - 35.4 g/dL   RDW, POC 18.9     Platelet Count, POC 371  142 - 424 K/uL   MPV 9.0  0 - 99.8 fL  GLUCOSE, POCT (MANUAL RESULT ENTRY)      Result Value Range   POC Glucose 281 (*) 70 - 99 mg/dl  POCT GLYCOSYLATED HEMOGLOBIN (HGB A1C)      Result Value Range   Hemoglobin A1C 7.8    IFOBT (OCCULT BLOOD)      Result Value Range   IFOBT Negative     Review of Systems  Constitutional: Negative for fever and chills.  Respiratory: Negative for cough and shortness of breath.   Cardiovascular: Negative for chest pain.    Gastrointestinal: Negative for abdominal pain.  Musculoskeletal: Negative for back pain.      Objective:   Physical Exam Physical Exam  Nursing note and vitals reviewed. Constitutional: Patient is oriented to person, place, and time. Patient appears well-developed and well-nourished. No distress.  HENT:  Head: Normocephalic and atraumatic.  Neck: Neck supple. No tracheal deviation present.  Cardiovascular: Normal rate, regular rhythm and normal heart sounds.   No murmur heard. Pulmonary/Chest: Effort normal and breath sounds normal. No respiratory distress. Patient has no wheezes. Patient has no rales.  Musculoskeletal: Normal range of motion.  Neurological: Patient is alert and oriented to person, place, and time.  Skin: Skin is warm and dry. He has significant scaly dry skin involving trunk and extremities . On the left forearm is a half centimeter raised firm papule which is surrounded by a centimeter and a half of raised firm tissue.  Psychiatric: Patient has a normal mood and affect. Patient's behavior is normal.  Results for orders placed in visit on 07/19/13  POCT CBC      Result Value Range   WBC 11.1 (*) 4.6 - 10.2 K/uL   Lymph, poc 0.9  0.6 - 3.4   POC LYMPH PERCENT 7.9 (*) 10 - 50 %L   MID (cbc) 0.5  0 - 0.9   POC MID % 4.7  0 - 12 %M   POC Granulocyte 9.7 (*) 2 - 6.9   Granulocyte percent 87.4 (*) 37 - 80 %G   RBC 4.90  4.69 - 6.13 M/uL   Hemoglobin 16.1  14.1 - 18.1 g/dL   HCT, POC 49.3  43.5 -  53.7 %   MCV 100.6 (*) 80 - 97 fL   MCH, POC 32.9 (*) 27 - 31.2 pg   MCHC 32.7  31.8 - 35.4 g/dL   RDW, POC 17.4     Platelet Count, POC 331  142 - 424 K/uL   MPV 8.8  0 - 99.8 fL  GLUCOSE, POCT (MANUAL RESULT ENTRY)      Result Value Range   POC Glucose 240 (*) 70 - 99 mg/dl  POCT GLYCOSYLATED HEMOGLOBIN (HGB A1C)      Result Value Range   Hemoglobin A1C 6.1     Triage Vitals: BP 138/82  Pulse 86  Temp(Src) 98.2 F (36.8 C) (Oral)  Resp 18  Ht 5\' 10"  (1.778 m)   Wt 249 lb (112.946 kg)  BMI 35.73 kg/m2  SpO2 94%  DIAGNOSTIC STUDIES: Oxygen Saturation is 94% on room air, adequate by my interpretation.    COORDINATION OF CARE: At 800 AM Discussed treatment plan with patient which includes blood work and prescription refill. Patient agrees.      Assessment & Plan:  Diabetes is under great control. I will make an appointment to see me for a full physical exam. Meds were reviewed.

## 2013-07-19 NOTE — Progress Notes (Signed)
   Patient ID: [name] MRN: [id number], DOB: [birth year], 73 y.o. Date of Encounter: 07/19/2013, 8:34 AM   PROCEDURE NOTE: Verbal consent obtained. Sterile technique employed. Numbing: Local anesthesia obtained with 1cc of 2% lidocaine with epinephrine.  Betadine prep per usual protocol.  3 mm punch biopsy taken from left forearm.  Biopsy placed in pathology transport medium, labeled, and form completed. Hemostasis obtained with silver nitrate. Wound cleansed and dressed. Wound care instructions including precautions covered with patient.   Signed, Christell Faith, MHS, PA-C [facility], [place] [address] [facility] 07/19/2013 8:34 AM

## 2013-07-24 NOTE — Progress Notes (Signed)
Patient states that he does not want or need a physical.

## 2013-09-12 ENCOUNTER — Encounter: Payer: Self-pay | Admitting: Emergency Medicine

## 2013-09-19 ENCOUNTER — Other Ambulatory Visit: Payer: Self-pay | Admitting: Emergency Medicine

## 2013-09-25 ENCOUNTER — Telehealth: Payer: Self-pay

## 2013-09-25 MED ORDER — GLUCOSE BLOOD VI STRP: ORAL_STRIP | Status: DC

## 2013-09-25 NOTE — Telephone Encounter (Signed)
Faxed Rx and notified pt. 

## 2013-09-25 NOTE — Telephone Encounter (Signed)
Pt requesting test strips refill   Best phone [place]

## 2013-09-25 NOTE — Telephone Encounter (Signed)
Rx printed and put at Ryan's computer to sign.

## 2013-12-23 ENCOUNTER — Other Ambulatory Visit: Payer: Self-pay | Admitting: Emergency Medicine

## 2014-03-28 ENCOUNTER — Other Ambulatory Visit: Payer: Self-pay | Admitting: Emergency Medicine

## 2014-07-07 ENCOUNTER — Ambulatory Visit (INDEPENDENT_AMBULATORY_CARE_PROVIDER_SITE_OTHER): Payer: Medicare HMO

## 2014-07-07 ENCOUNTER — Ambulatory Visit (INDEPENDENT_AMBULATORY_CARE_PROVIDER_SITE_OTHER): Payer: Medicare HMO | Admitting: Emergency Medicine

## 2014-07-07 ENCOUNTER — Encounter: Payer: Self-pay | Admitting: Emergency Medicine

## 2014-07-07 VITALS — BP 152/82 | HR 92 | Temp 97.8°F | Resp 18 | Ht 69.0 in | Wt 236.2 lb

## 2014-07-07 DIAGNOSIS — S22000A Wedge compression fracture of unspecified thoracic vertebra, initial encounter for closed fracture: Secondary | ICD-10-CM

## 2014-07-07 DIAGNOSIS — R0602 Shortness of breath: Secondary | ICD-10-CM

## 2014-07-07 DIAGNOSIS — N390 Urinary tract infection, site not specified: Secondary | ICD-10-CM

## 2014-07-07 DIAGNOSIS — K219 Gastro-esophageal reflux disease without esophagitis: Secondary | ICD-10-CM

## 2014-07-07 DIAGNOSIS — R609 Edema, unspecified: Secondary | ICD-10-CM

## 2014-07-07 DIAGNOSIS — M1990 Unspecified osteoarthritis, unspecified site: Secondary | ICD-10-CM

## 2014-07-07 DIAGNOSIS — E785 Hyperlipidemia, unspecified: Secondary | ICD-10-CM

## 2014-07-07 DIAGNOSIS — I25812 Atherosclerosis of bypass graft of coronary artery of transplanted heart without angina pectoris: Secondary | ICD-10-CM

## 2014-07-07 DIAGNOSIS — I10 Essential (primary) hypertension: Secondary | ICD-10-CM

## 2014-07-07 DIAGNOSIS — M8448XA Pathological fracture, other site, initial encounter for fracture: Secondary | ICD-10-CM

## 2014-07-07 DIAGNOSIS — R8281 Pyuria: Secondary | ICD-10-CM

## 2014-07-07 DIAGNOSIS — L03116 Cellulitis of left lower limb: Secondary | ICD-10-CM

## 2014-07-07 DIAGNOSIS — E119 Type 2 diabetes mellitus without complications: Secondary | ICD-10-CM

## 2014-07-07 DIAGNOSIS — I25810 Atherosclerosis of coronary artery bypass graft(s) without angina pectoris: Secondary | ICD-10-CM

## 2014-07-07 DIAGNOSIS — D169 Benign neoplasm of bone and articular cartilage, unspecified: Secondary | ICD-10-CM

## 2014-07-07 LAB — POCT UA - MICROSCOPIC ONLY
BACTERIA, U MICROSCOPIC: NEGATIVE
CASTS, UR, LPF, POC: NEGATIVE
CRYSTALS, UR, HPF, POC: NEGATIVE
EPITHELIAL CELLS, URINE PER MICROSCOPY: NEGATIVE
MUCUS UA: NEGATIVE
YEAST UA: NEGATIVE

## 2014-07-07 LAB — POCT URINALYSIS DIPSTICK
BILIRUBIN UA: NEGATIVE
Glucose, UA: NEGATIVE
Ketones, UA: NEGATIVE
Nitrite, UA: POSITIVE
PROTEIN UA: 100
SPEC GRAV UA: 1.015
Urobilinogen, UA: >=8
pH, UA: 7.5

## 2014-07-07 LAB — COMPLETE METABOLIC PANEL WITH GFR
ALBUMIN: 4.2 g/dL (ref 3.5–5.2)
ALK PHOS: 83 U/L (ref 39–117)
ALT: 9 U/L (ref 0–53)
AST: 12 U/L (ref 0–37)
BUN: 18 mg/dL (ref 6–23)
CALCIUM: 9.7 mg/dL (ref 8.4–10.5)
CO2: 30 meq/L (ref 19–32)
Chloride: 97 mEq/L (ref 96–112)
Creat: 0.78 mg/dL (ref 0.50–1.35)
GFR, Est African American: >89 mL/min
GLUCOSE: 145 mg/dL — AB (ref 70–99)
Potassium: 4.2 mEq/L (ref 3.5–5.3)
SODIUM: 140 meq/L (ref 135–145)
Total Bilirubin: 1.1 mg/dL (ref 0.2–1.2)
Total Protein: 6.8 g/dL (ref 6.0–8.3)

## 2014-07-07 LAB — POCT CBC
Granulocyte percent: 90.3 %G — AB (ref 37–80)
HCT, POC: 42 % — AB (ref 43.5–53.7)
HEMOGLOBIN: 13 g/dL — AB (ref 14.1–18.1)
LYMPH, POC: 0.9 (ref 0.6–3.4)
MCH: 22.2 pg — AB (ref 27–31.2)
MCHC: 30.9 g/dL — AB (ref 31.8–35.4)
MCV: 71.8 fL — AB (ref 80–97)
MID (cbc): 0.7 (ref 0–0.9)
MPV: 7.4 fL (ref 0–99.8)
POC Granulocyte: 14.9 — AB (ref 2–6.9)
POC LYMPH PERCENT: 5.2 %L — AB (ref 10–50)
POC MID %: 4.5 %M (ref 0–12)
Platelet Count, POC: 589 10*3/uL — AB (ref 142–424)
RBC: 5.84 M/uL (ref 4.69–6.13)
RDW, POC: 20.7 %
WBC: 16.5 10*3/uL — AB (ref 4.6–10.2)

## 2014-07-07 LAB — LIPID PANEL
Cholesterol: 106 mg/dL (ref 0–200)
HDL: 31 mg/dL — ABNORMAL LOW (ref >39)
LDL Cholesterol: 44 mg/dL (ref 0–99)
TRIGLYCERIDES: 153 mg/dL — AB (ref <150)
Total CHOL/HDL Ratio: 3.4 Ratio
VLDL: 31 mg/dL (ref 0–40)

## 2014-07-07 LAB — BRAIN NATRIURETIC PEPTIDE: Brain Natriuretic Peptide: 68.6 pg/mL (ref 0.0–100.0)

## 2014-07-07 LAB — POCT GLYCOSYLATED HEMOGLOBIN (HGB A1C): HEMOGLOBIN A1C: 5.7

## 2014-07-07 LAB — TSH: TSH: 2.251 u[IU]/mL (ref 0.350–4.500)

## 2014-07-07 LAB — GLUCOSE, POCT (MANUAL RESULT ENTRY): POC GLUCOSE: 174 mg/dL — AB (ref 70–99)

## 2014-07-07 MED ORDER — POTASSIUM CHLORIDE CRYS ER 20 MEQ PO TBCR: 20.0000 meq | EXTENDED_RELEASE_TABLET | Freq: Two times a day (BID) | ORAL | Status: AC

## 2014-07-07 MED ORDER — ROSUVASTATIN CALCIUM 20 MG PO TABS: 20.0000 mg | ORAL_TABLET | Freq: Every day | ORAL | Status: AC

## 2014-07-07 MED ORDER — OMEPRAZOLE 20 MG PO CPDR: 20.0000 mg | DELAYED_RELEASE_CAPSULE | Freq: Every day | ORAL | Status: AC

## 2014-07-07 MED ORDER — TRIAMCINOLONE ACETONIDE 0.1 % EX CREA: TOPICAL_CREAM | Freq: Two times a day (BID) | CUTANEOUS | Status: AC

## 2014-07-07 MED ORDER — ALBUTEROL SULFATE HFA 108 (90 BASE) MCG/ACT IN AERS: 2.0000 | INHALATION_SPRAY | RESPIRATORY_TRACT | Status: DC | PRN

## 2014-07-07 MED ORDER — FUROSEMIDE 40 MG PO TABS
ORAL_TABLET | ORAL | Status: DC
Start: 2014-07-07 — End: 2015-05-12

## 2014-07-07 MED ORDER — LISINOPRIL 10 MG PO TABS: 10.0000 mg | ORAL_TABLET | Freq: Every day | ORAL | Status: DC

## 2014-07-07 MED ORDER — SILVER SULFADIAZINE 1 % EX CREA: 1.0000 "application " | TOPICAL_CREAM | Freq: Two times a day (BID) | CUTANEOUS | Status: DC

## 2014-07-07 MED ORDER — TRIAMTERENE-HCTZ 37.5-25 MG PO TABS
1.0000 | ORAL_TABLET | Freq: Every day | ORAL | Status: DC
Start: 2014-07-07 — End: 2014-07-09

## 2014-07-07 MED ORDER — DOXYCYCLINE HYCLATE 100 MG PO CAPS: 100.0000 mg | ORAL_CAPSULE | Freq: Two times a day (BID) | ORAL | Status: DC

## 2014-07-07 MED ORDER — FERROUS SULFATE 325 (65 FE) MG PO TABS: 325.0000 mg | ORAL_TABLET | Freq: Two times a day (BID) | ORAL | Status: AC

## 2014-07-07 NOTE — Addendum Note (Signed)
Addended by: [name] A on: 07/07/2014 11:47 AM   Modules accepted: Orders

## 2014-07-07 NOTE — Progress Notes (Addendum)
Subjective:  This chart was scribed for Eric Jordan, MD by [name], ED Scribe at [facility].The patient was seen in exam room 02 and the patient's care was started at 8:42 AM.   Patient ID: [name], male    DOB: September 05, 1940, 74 y.o.   MRN: [id number] Chief Complaint  Patient presents with  . Leg Swelling    both legs  . Medication Refill   HPI HPI Comments: [name] is a 74 y.o. male with a history of DM, coronary artery disease, hypertension, hyperlipidemia who presents to [facility] complaining of ongoing leg swelling bilaterally. Pt reports his leg swelling has "vastly improved". He also needs a medication refill. He retired in 2012 and reports he has not been very active. He states he recently starting riding the stationary bike for 25 min. Has SOB and is using his wife's albuterol for relief. SOB is usually in the morning. Pt requests a prescription for albuterol inhaler. Pt was last seen by his cardiologist last year. Pt fell last year while drinking alchohol, and notes having some memory loss since but is unsure if this related to old age. Pt states he cannot have a rectal exam because he has very loose stools recently. Pt has three children. He denies CP.  Patient Active Problem List   Diagnosis Date Noted  . Gallstones 06/09/2012  . Liver mass 06/09/2012  . Right sided abdominal pain 05/23/2012  . DM (diabetes mellitus) 09/01/2011  . CAD (coronary artery disease) 09/01/2011  . Hypertension 09/01/2011  . Hyperlipidemia 09/01/2011   Past Medical History  Diagnosis Date  . Allergy   . Arthritis   . Diabetes mellitus without complication   . Hypertension   . CHF (congestive heart failure)   . GERD (gastroesophageal reflux disease)   . Hyperlipidemia    Past Surgical History  Procedure Laterality Date  . Joint replacement    . Spine surgery    . Heart stent  2002   Allergies  Allergen Reactions  . Penicillins Shortness Of Breath    Prior to Admission medications   Medication Sig Start Date End Date Taking? Authorizing Provider  amLODipine (NORVASC) 5 MG tablet Take 1 tablet (5 mg total) by mouth daily. 07/19/13  Yes Darlyne Russian, MD  Coenzyme Q10 (CO Q 10) 100 MG CAPS Take 400 mg by mouth daily.   Yes Historical Provider, MD  ferrous sulfate 325 (65 FE) MG tablet Take 1 tablet (325 mg total) by mouth 2 (two) times daily. 07/19/13  Yes Darlyne Russian, MD  furosemide (LASIX) 40 MG tablet Take 1 tablet (40 mg total) by mouth 2 (two) times daily. 07/19/13  Yes Darlyne Russian, MD  glucose blood (ONE TOUCH ULTRA TEST) test strip Test blood sugar daily. Dx code: 250.00 09/25/13  Yes Ryan Lyn Hollingshead, PA-C  HYDROcodone-acetaminophen (NORCO/VICODIN) 5-325 MG per tablet Take one tablet at night as needed for pain 10/07/12  Yes Darlyne Russian, MD  lisinopril (PRINIVIL,ZESTRIL) 10 MG tablet Take 1 tablet (10 mg total) by mouth daily. 07/19/13  Yes Darlyne Russian, MD  metFORMIN (GLUCOPHAGE-XR) 500 MG 24 hr tablet Take 2 pills (1000mg ) twice daily 07/19/13  Yes Darlyne Russian, MD  metoprolol (LOPRESSOR) 100 MG tablet Take 1 tablet (100 mg total) by mouth 2 (two) times daily. 07/19/13  Yes Darlyne Russian, MD  omeprazole (PRILOSEC) 20 MG capsule Take 1 capsule (20 mg total) by mouth daily. 07/19/13  Yes Darlyne Russian, MD  potassium chloride SA (K-DUR,KLOR-CON) 20 MEQ tablet Take 1 tablet (20 mEq total) by mouth 2 (two) times daily. 07/19/13  Yes Darlyne Russian, MD  rosuvastatin (CRESTOR) 20 MG tablet Take 1 tablet (20 mg total) by mouth daily. 07/19/13  Yes Darlyne Russian, MD  sitaGLIPtin (JANUVIA) 100 MG tablet Take 1 tablet (100 mg total) by mouth daily. 07/19/13  Yes Darlyne Russian, MD  triamcinolone cream (KENALOG) 0.1 % APPLY TO AFFECTED AREA TWICE A DAY AS DIRECTED 12/10/12  Yes Ryan M Dunn, PA-C  triamterene-hydrochlorothiazide (MAXZIDE-25) 37.5-25 MG per tablet Take 1 tablet by mouth daily. 07/19/13  Yes Darlyne Russian, MD   History   Social History  .  Marital Status: Married    Spouse Name: N/A    Number of Children: N/A  . Years of Education: N/A   Occupational History  . Not on file.   Social History Main Topics  . Smoking status: Former Smoker -- 2.00 packs/day for 15 years    Types: Cigarettes    Quit date: 08/31/2001  . Smokeless tobacco: Never Used  . Alcohol Use: Yes     Comment: occ  . Drug Use: No  . Sexual Activity: Not on file   Other Topics Concern  . Not on file   Social History Narrative     Review of Systems  Respiratory: Positive for shortness of breath.   Cardiovascular: Positive for leg swelling. Negative for chest pain.  Skin: Positive for color change.       Objective:  BP 152/82 mmHg  Pulse 92  Temp(Src) 97.8 F (36.6 C) (Oral)  Resp 18  Ht 5\' 9"  (1.753 m)  Wt 236 lb 3.2 oz (107.14 kg)  BMI 34.86 kg/m2  SpO2 91%  Physical Exam  Constitutional: He is oriented to person, place, and time. He appears well-developed and well-nourished.  HENT:  Head: Normocephalic and atraumatic.  Eyes: EOM are normal.  Bilateral cataract surgery  Neck: Normal range of motion.  Neck is supple with no cartoid bruits  Cardiovascular: Normal rate, regular rhythm and normal heart sounds.   No murmur heard. Pulmonary/Chest: Effort normal and breath sounds normal.  Abdominal:  Abdomen is obese with no masses and no tenderness.  Musculoskeletal: Normal range of motion.  4+ edema of both legs. There is stasis changes of both legs. There are open wounds on the back of both legs. Unable to palpable pluses due to edema.   Neurological: He is alert and oriented to person, place, and time.  Skin: Skin is warm and dry.  Psychiatric: He has a normal mood and affect. His behavior is normal.  Nursing note and vitals reviewed.  [facility] reading (PRIMARY) by  Dr Everlene Farrier. There is a 3 x 3 and's calcified mass adjacent to the left scapula. In retrospect this is been present at least since 2010 please comment cardiac shadow is  enlarged there does appear to be signs of failure. There is fluid in the right pleural space.  Results for orders placed or performed in visit on 07/07/14  POCT urinalysis dipstick  Result Value Ref Range   Color, UA orange    Clarity, UA cloudy    Glucose, UA neg    Bilirubin, UA neg    Ketones, UA neg    Spec Grav, UA 1.015    Blood, UA trace    pH, UA 7.5    Protein, UA 100    Urobilinogen, UA >=8.0  Nitrite, UA positive    Leukocytes, UA moderate (2+)   POCT UA - Microscopic Only  Result Value Ref Range   WBC, Ur, HPF, POC TNTC    RBC, urine, microscopic 0-3    Bacteria, U Microscopic neg    Mucus, UA neg    Epithelial cells, urine per micros neg    Crystals, Ur, HPF, POC neg    Casts, Ur, LPF, POC neg    Yeast, UA neg   POCT glucose (manual entry)  Result Value Ref Range   POC Glucose 174 (A) 70 - 99 mg/dl  POCT glycosylated hemoglobin (Hb A1C)  Result Value Ref Range   Hemoglobin A1C 5.7         Assessment & Plan:  Patient does not appear that he is taking care of himself. He has severe edema of both legs. He has breakdown of the skin over both legs. He has severe stasis changes of the lower extremities. His chest x-ray is concerning for early failure. I chose to stop his amlodipine. He will increase his Lasix to 2 in the morning and one at night. Appointment has been made with cardiology for their help. He was placed on Silvadene cream to use to his wounds twice a day. He will be covered with doxycycline to take twice a day. Culture was done on the weeping area left leg. According to his wife he sits at a computer all day does no exercise and has been very interactive since his retirement a few years ago.I personally performed the services described in this documentation, which was scribed in my presence. The recorded information has been reviewed and is accurate. He also has a T12 compression fracture not present on his previous x-ray. He is asymptomatic regarding pain  in this area. I believe addressing his cardiac and edema issues take priority over further evaluation of this. 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

## 2014-07-07 NOTE — Addendum Note (Signed)
Addended by: Mercie Eon, Dario Yono B on: 07/07/2014 11:42 AM   Modules accepted: Orders

## 2014-07-08 LAB — PSA, MEDICARE: PSA: 1.65 ng/mL (ref <=4.00)

## 2014-07-09 ENCOUNTER — Other Ambulatory Visit: Payer: Self-pay

## 2014-07-09 DIAGNOSIS — R1013 Epigastric pain: Secondary | ICD-10-CM

## 2014-07-09 DIAGNOSIS — E119 Type 2 diabetes mellitus without complications: Secondary | ICD-10-CM

## 2014-07-09 DIAGNOSIS — I25812 Atherosclerosis of bypass graft of coronary artery of transplanted heart without angina pectoris: Secondary | ICD-10-CM

## 2014-07-09 LAB — WOUND CULTURE: Gram Stain: NONE SEEN

## 2014-07-09 LAB — URINE CULTURE: Colony Count: >=100000

## 2014-07-09 MED ORDER — HYDROCODONE-ACETAMINOPHEN 5-325 MG PO TABS: ORAL_TABLET | ORAL | Status: DC

## 2014-07-09 MED ORDER — TRIAMTERENE-HCTZ 37.5-25 MG PO TABS: 1.0000 | ORAL_TABLET | Freq: Every day | ORAL | Status: DC

## 2014-07-09 MED ORDER — SITAGLIPTIN PHOSPHATE 100 MG PO TABS: 100.0000 mg | ORAL_TABLET | Freq: Every day | ORAL | Status: DC

## 2014-07-09 MED ORDER — METOPROLOL TARTRATE 100 MG PO TABS: 100.0000 mg | ORAL_TABLET | Freq: Two times a day (BID) | ORAL | Status: AC

## 2014-07-09 MED ORDER — METFORMIN HCL ER 500 MG PO TB24: ORAL_TABLET | ORAL | Status: AC

## 2014-07-09 NOTE — Telephone Encounter (Signed)
Pt called and reported that 3 of his Rxs were not sent in to [facility] yesterday (metformin, metoprolol and Januvia). Also I discovered that Maxide was sent to [facility] instead of [facility] and I have changed that now. Dr Everlene Farrier, I pended the 3 RFs bc you may want to put more RFs on them than I can do per protocol according to what you put on others. Also, pt wanted to know if he can get a RF on his hydrocodone that he rarely uses but needs occasionally. If you can't do this now, he will talk w/you about it when he comes in to see you next Monday. I pended this as well.

## 2014-07-10 NOTE — Telephone Encounter (Signed)
Notified pt all Rxs were sent/ready.

## 2014-07-15 ENCOUNTER — Ambulatory Visit (INDEPENDENT_AMBULATORY_CARE_PROVIDER_SITE_OTHER): Payer: Medicare HMO | Admitting: Emergency Medicine

## 2014-07-15 VITALS — BP 148/80 | HR 85 | Temp 98.4°F | Resp 16 | Ht 69.0 in | Wt 234.0 lb

## 2014-07-15 DIAGNOSIS — E119 Type 2 diabetes mellitus without complications: Secondary | ICD-10-CM

## 2014-07-15 DIAGNOSIS — I10 Essential (primary) hypertension: Secondary | ICD-10-CM

## 2014-07-15 DIAGNOSIS — D72829 Elevated white blood cell count, unspecified: Secondary | ICD-10-CM

## 2014-07-15 DIAGNOSIS — R609 Edema, unspecified: Secondary | ICD-10-CM

## 2014-07-15 LAB — POCT CBC
Granulocyte percent: 91.9 %G — AB (ref 37–80)
HCT, POC: 41.7 % — AB (ref 43.5–53.7)
Hemoglobin: 12.7 g/dL — AB (ref 14.1–18.1)
Lymph, poc: 0.8 (ref 0.6–3.4)
MCH, POC: 22.1 pg — AB (ref 27–31.2)
MCHC: 30.4 g/dL — AB (ref 31.8–35.4)
MCV: 72.8 fL — AB (ref 80–97)
MID (CBC): 0.6 (ref 0–0.9)
MPV: 7.1 fL (ref 0–99.8)
PLATELET COUNT, POC: 562 10*3/uL — AB (ref 142–424)
POC Granulocyte: 16.3 — AB (ref 2–6.9)
POC LYMPH PERCENT: 4.6 %L — AB (ref 10–50)
POC MID %: 3.5 %M (ref 0–12)
RBC: 5.73 M/uL (ref 4.69–6.13)
RDW, POC: 20.1 %
WBC: 17.7 10*3/uL — AB (ref 4.6–10.2)

## 2014-07-15 LAB — GLUCOSE, POCT (MANUAL RESULT ENTRY): POC Glucose: 144 mg/dl — AB (ref 70–99)

## 2014-07-15 LAB — BASIC METABOLIC PANEL
BUN: 18 mg/dL (ref 6–23)
CALCIUM: 9.5 mg/dL (ref 8.4–10.5)
CO2: 35 mEq/L — ABNORMAL HIGH (ref 19–32)
Chloride: 98 mEq/L (ref 96–112)
Creat: 0.73 mg/dL (ref 0.50–1.35)
GLUCOSE: 124 mg/dL — AB (ref 70–99)
POTASSIUM: 4.4 meq/L (ref 3.5–5.3)
Sodium: 140 mEq/L (ref 135–145)

## 2014-07-15 MED ORDER — DOXYCYCLINE HYCLATE 100 MG PO CAPS: 100.0000 mg | ORAL_CAPSULE | Freq: Two times a day (BID) | ORAL | Status: DC

## 2014-07-15 NOTE — Progress Notes (Addendum)
Subjective:    Patient ID: [name], male    DOB: June 23, 1941, 74 y.o.   MRN: [id number] This chart was scribed for [name], MD by Marti Sleigh, Medical Scribe. This patient was seen in Room 11 and the patient's care was started at 8:43 AM.  Chief Complaint  Patient presents with  . Follow-up    Edema both legs  . Edema    HPI HPI Comments: [name] is a 74 y.o. male who presents to [facility] reporting for a follow up appointment. Pt states the edema and wounds on his legs are both improving. Pt states he has an appointment with his cardiologist at [facility] within the next two weeks. Pt states he is taking two laciscs in the morning and one at night. Pt states since he has been retired he has gotten a bit depressed, and has been sitting on his computer too much. Pt states he has been riding his stationary bike lately and feeling better. Pt states he is elevating his legs regularly.    Review of Systems  Constitutional: Negative for fever and chills.  Cardiovascular: Positive for leg swelling.  Genitourinary: Negative for dysuria.  Skin: Positive for color change and wound.       Objective:   Physical Exam  Constitutional: He is oriented to person, place, and time. He appears well-developed and well-nourished.  HENT:  Head: Normocephalic and atraumatic.  Eyes: Pupils are equal, round, and reactive to light.  Neck: Neck supple. No thyromegaly present.  Cardiovascular: Normal rate, regular rhythm and normal heart sounds.   No murmur heard. Pulmonary/Chest: Effort normal and breath sounds normal. No respiratory distress.  No rales, breath sounds clear.  Musculoskeletal: He exhibits edema.  Lymphadenopathy:    He has no cervical adenopathy.  Neurological: He is alert and oriented to person, place, and time.  Skin: Skin is warm and dry.  Ceborrheic changes atround his face, especially eyebrows and scalp. 3+ pitting edema on legs bilaterally. Stasis changes of both  legs. Healing ulcers on the back of both legs.  Psychiatric: He has a normal mood and affect. His behavior is normal.  Nursing note and vitals reviewed.  Results for orders placed or performed in visit on 07/15/14  POCT CBC  Result Value Ref Range   WBC 17.7 (A) 4.6 - 10.2 K/uL   Lymph, poc 0.8 0.6 - 3.4   POC LYMPH PERCENT 4.6 (A) 10 - 50 %L   MID (cbc) 0.6 0 - 0.9   POC MID % 3.5 0 - 12 %M   POC Granulocyte 16.3 (A) 2 - 6.9   Granulocyte percent 91.9 (A) 37 - 80 %G   RBC 5.73 4.69 - 6.13 M/uL   Hemoglobin 12.7 (A) 14.1 - 18.1 g/dL   HCT, POC 41.7 (A) 43.5 - 53.7 %   MCV 72.8 (A) 80 - 97 fL   MCH, POC 22.1 (A) 27 - 31.2 pg   MCHC 30.4 (A) 31.8 - 35.4 g/dL   RDW, POC 20.1 %   Platelet Count, POC 562 (A) 142 - 424 K/uL   MPV 7.1 0 - 99.8 fL  POCT glucose (manual entry)  Result Value Ref Range   POC Glucose 144 (A) 70 - 99 mg/dl       Assessment & Plan:  Patient does look better. He has lost 2-1/2 pounds. He does have an appointment to see the cardiologist. His wounds on his legs are improved. He was again encouraged to continue  exercising to keep his legs elevated and we would see him again in about 6 weeks. He is due to see the cardiologist as noted in about 2-1/2 weeks.I pHe declines any immunizations today. He declines to have a CT of his back.ersonally performed the services described in this documentation, which was scribed in my presence. His white count is still significantly elevated. I suspect this may be secondary to the cellulitis in his lower extremities. I do want him to continue on doxycycline is feeling better. We'll recheck his white count and legs in 10 days. I called and talked to the patient and suggested he have a Doppler study of both legs but he declined at the present time and does not want to have this done. The recorded information has been reviewed and is accurate.

## 2014-07-16 LAB — PATHOLOGIST SMEAR REVIEW

## 2014-07-24 ENCOUNTER — Ambulatory Visit (INDEPENDENT_AMBULATORY_CARE_PROVIDER_SITE_OTHER): Payer: Medicare HMO | Admitting: Emergency Medicine

## 2014-07-24 VITALS — BP 178/93 | HR 94 | Temp 98.2°F | Resp 18 | Ht 69.0 in | Wt 234.0 lb

## 2014-07-24 DIAGNOSIS — E1151 Type 2 diabetes mellitus with diabetic peripheral angiopathy without gangrene: Secondary | ICD-10-CM

## 2014-07-24 DIAGNOSIS — R1013 Epigastric pain: Secondary | ICD-10-CM

## 2014-07-24 DIAGNOSIS — L03115 Cellulitis of right lower limb: Secondary | ICD-10-CM

## 2014-07-24 DIAGNOSIS — L03116 Cellulitis of left lower limb: Secondary | ICD-10-CM

## 2014-07-24 DIAGNOSIS — I798 Other disorders of arteries, arterioles and capillaries in diseases classified elsewhere: Secondary | ICD-10-CM

## 2014-07-24 LAB — POCT CBC
Granulocyte percent: 91.3 %G — AB (ref 37–80)
HEMATOCRIT: 42.7 % — AB (ref 43.5–53.7)
Hemoglobin: 12.3 g/dL — AB (ref 14.1–18.1)
Lymph, poc: 0.9 (ref 0.6–3.4)
MCH, POC: 21.6 pg — AB (ref 27–31.2)
MCHC: 28.8 g/dL — AB (ref 31.8–35.4)
MCV: 74.8 fL — AB (ref 80–97)
MID (cbc): 0.5 (ref 0–0.9)
MPV: 7.2 fL (ref 0–99.8)
POC Granulocyte: 14.7 — AB (ref 2–6.9)
POC LYMPH %: 5.5 % — AB (ref 10–50)
POC MID %: 3.2 % (ref 0–12)
Platelet Count, POC: 550 10*3/uL — AB (ref 142–424)
RBC: 5.71 M/uL (ref 4.69–6.13)
RDW, POC: 21.4 %
WBC: 16.1 10*3/uL — AB (ref 4.6–10.2)

## 2014-07-24 LAB — GLUCOSE, POCT (MANUAL RESULT ENTRY): POC GLUCOSE: 158 mg/dL — AB (ref 70–99)

## 2014-07-24 MED ORDER — HYDROCODONE-ACETAMINOPHEN 5-325 MG PO TABS: ORAL_TABLET | ORAL | Status: DC

## 2014-07-24 MED ORDER — DOXYCYCLINE HYCLATE 100 MG PO CAPS: 100.0000 mg | ORAL_CAPSULE | Freq: Two times a day (BID) | ORAL | Status: DC

## 2014-07-24 NOTE — Progress Notes (Addendum)
Subjective:    Patient ID: [name], male    DOB: September 24, 1940, 74 y.o.   MRN: [id number] This chart was scribed for [name], MD by Marti Sleigh, Medical Scribe. This patient was seen in Room 11 and the patient's care was started at 8:36 AM.  Chief Complaint  Patient presents with  . Recheck Legs    Bilateral  . Blood Work    HPI HPI Comments: [name] is a 74 y.o. male with a hx of CAD, HTN, DM, and HLD who presents to [facility] reporting for a follow up appointment concerning swelling and wounds on his legs. Pt states the wounds on his legs are improving. Pt states that the wound on the right leg is much better.   HPI Comments from last visit:  [name] is a 74 y.o. male who presents to [facility] reporting for a follow up appointment. Pt states the edema and wounds on his legs are both improving. Pt states he has an appointment with his cardiologist at [facility] within the next two weeks. Pt states he is taking two laciscs in the morning and one at night. Pt states since he has been retired he has gotten a bit depressed, and has been sitting on his computer too much. Pt states he has been riding his stationary bike lately and feeling better. Pt states he is elevating his legs regularly.    Review of Systems  Constitutional: Negative for fever and chills.  Cardiovascular: Positive for leg swelling.  Skin: Positive for color change, rash and wound.  Psychiatric/Behavioral: Negative for sleep disturbance.       Objective:   Physical Exam  Constitutional: He is oriented to person, place, and time. He appears well-developed and well-nourished.  HENT:  Head: Normocephalic and atraumatic.  Eyes: Pupils are equal, round, and reactive to light.  Neck: Neck supple.  Cardiovascular: Normal rate and regular rhythm.   Pulmonary/Chest: Effort normal and breath sounds normal. No respiratory distress.  Neurological: He is alert and oriented to person, place, and time.  Skin: Skin  is warm and dry.  1/3cm open wound on posterior right calf.  Psychiatric: He has a normal mood and affect. His behavior is normal.  Nursing note and vitals reviewed.  Results for orders placed or performed in visit on 07/24/14  POCT CBC  Result Value Ref Range   WBC 16.1 (A) 4.6 - 10.2 K/uL   Lymph, poc 0.9 0.6 - 3.4   POC LYMPH PERCENT 5.5 (A) 10 - 50 %L   MID (cbc) 0.5 0 - 0.9   POC MID % 3.2 0 - 12 %M   POC Granulocyte 14.7 (A) 2 - 6.9   Granulocyte percent 91.3 (A) 37 - 80 %G   RBC 5.71 4.69 - 6.13 M/uL   Hemoglobin 12.3 (A) 14.1 - 18.1 g/dL   HCT, POC 42.7 (A) 43.5 - 53.7 %   MCV 74.8 (A) 80 - 97 fL   MCH, POC 21.6 (A) 27 - 31.2 pg   MCHC 28.8 (A) 31.8 - 35.4 g/dL   RDW, POC 21.4 %   Platelet Count, POC 550 (A) 142 - 424 K/uL   MPV 7.2 0 - 99.8 fL  POCT glucose (manual entry)  Result Value Ref Range   POC Glucose 158 (A) 70 - 99 mg/dl      Assessment & Plan:  Weight is stable. I am concerned about his CBC. He has an elevated white count with a decreased  hemoglobin and high platelet count. I advised the patient to have a referral to hematology for their opinion. Patient refuses this at present. Will add a protein electrophoresis and immunoelectrophoresis. Patient does have a bony lesion around the left scapula. Question whether this could represent a plasma cytoma. Patient does agree to come back in 2 weeks. I personally performed the services described in this documentation, which was scribed in my presence. The recorded information has been reviewed and is accurate. I did repeat his blood pressure before he left that was significantly better at 158/80

## 2014-07-26 LAB — PROTEIN ELECTROPHORESIS, SERUM
ALBUMIN ELP: 59.9 % (ref 55.8–66.1)
ALPHA-1-GLOBULIN: 5.3 % — AB (ref 2.9–4.9)
ALPHA-2-GLOBULIN: 9.4 % (ref 7.1–11.8)
BETA 2: 4.5 % (ref 3.2–6.5)
BETA GLOBULIN: 7.4 % — AB (ref 4.7–7.2)
GAMMA GLOBULIN: 13.5 % (ref 11.1–18.8)
Total Protein, Serum Electrophoresis: 6.6 g/dL (ref 6.0–8.3)

## 2014-07-26 LAB — IMMUNOFIXATION ELECTROPHORESIS
IGA: 206 mg/dL (ref 68–379)
IGM, SERUM: 86 mg/dL (ref 41–251)
IgG (Immunoglobin G), Serum: 858 mg/dL (ref 650–1600)
Total Protein, Serum Electrophoresis: 6.6 g/dL (ref 6.0–8.3)

## 2014-07-27 ENCOUNTER — Other Ambulatory Visit: Payer: Self-pay | Admitting: Radiology

## 2014-07-27 DIAGNOSIS — D72829 Elevated white blood cell count, unspecified: Secondary | ICD-10-CM

## 2014-07-27 DIAGNOSIS — D649 Anemia, unspecified: Secondary | ICD-10-CM

## 2014-07-27 DIAGNOSIS — R7989 Other specified abnormal findings of blood chemistry: Secondary | ICD-10-CM

## 2014-07-31 ENCOUNTER — Ambulatory Visit (INDEPENDENT_AMBULATORY_CARE_PROVIDER_SITE_OTHER): Payer: Medicare HMO | Admitting: Cardiovascular Disease

## 2014-07-31 ENCOUNTER — Encounter: Payer: Self-pay | Admitting: Cardiovascular Disease

## 2014-07-31 VITALS — BP 160/82 | HR 85 | Ht 69.0 in | Wt 237.1 lb

## 2014-07-31 DIAGNOSIS — I25810 Atherosclerosis of coronary artery bypass graft(s) without angina pectoris: Secondary | ICD-10-CM

## 2014-07-31 DIAGNOSIS — R600 Localized edema: Secondary | ICD-10-CM

## 2014-07-31 DIAGNOSIS — I2510 Atherosclerotic heart disease of native coronary artery without angina pectoris: Secondary | ICD-10-CM

## 2014-07-31 DIAGNOSIS — I10 Essential (primary) hypertension: Secondary | ICD-10-CM

## 2014-07-31 MED ORDER — LISINOPRIL 20 MG PO TABS: 20.0000 mg | ORAL_TABLET | Freq: Every day | ORAL | Status: AC

## 2014-07-31 NOTE — Progress Notes (Addendum)
Cardiology Office Note   Date:  07/31/2014   ID:  [name], DOB 11-12-1940, MRN [id number]  PCP:  Jenny Reichmann, MD  Cardiologist:   Thayer Headings, MD   Problem List: 1. CAD - 3.0 x 15 mm Nir Elite stent 3.0 x 15 to mid LAD Janene Madeira, MD) 2. Hypertension 3. Diabetes mellitus 4. RBBB 5. Bilateral hip replacement 6. Bilateral knee arthroscopic surgeries 7. Back surgeries.  Chief Complaint  Patient presents with  . Follow-up    CAD      History of Present Illness: [name] is a 74 y.o. male who presents for follow up of his CAD. He has not been her in 1 1/2 years.  Retired several years ago and now has been spending "too much time in front of the computer" according to him. He has had progressive leg swelling for years that has worsened over the past year.  The swelling used to be up over his knees and has now improved.  His wife has bought a stationary bike.   He rides 1/2  Hour  a day . His leg swelling has greatly improved dramatically   He has been having some dyspnea and has been using his wife's inhaler.  He was seen by Dr. Everlene Farrier and was told to come back to see [place]. He denies any chest pain  He takes co-Q 10.         Past Medical History  Diagnosis Date  . Allergy   . Arthritis   . Diabetes mellitus without complication   . Hypertension   . CHF (congestive heart failure)   . GERD (gastroesophageal reflux disease)   . Hyperlipidemia     Past Surgical History  Procedure Laterality Date  . Joint replacement    . Spine surgery    . Heart stent  2002     Current Outpatient Prescriptions  Medication Sig Dispense Refill  . albuterol (PROVENTIL HFA;VENTOLIN HFA) 108 (90 BASE) MCG/ACT inhaler Inhale 2 puffs into the lungs every 4 (four) hours as needed for wheezing or shortness of breath (cough, shortness of breath or wheezing.). 1 Inhaler 1  . Coenzyme Q10 (CO Q 10) 100 MG CAPS Take 400 mg by mouth daily.    Marland Kitchen doxycycline (VIBRAMYCIN)  100 MG capsule Take 1 capsule (100 mg total) by mouth 2 (two) times daily. 20 capsule 0  . ferrous sulfate 325 (65 FE) MG tablet Take 1 tablet (325 mg total) by mouth 2 (two) times daily. 180 tablet 3  . furosemide (LASIX) 40 MG tablet Take two 40 mg tablets by mouth in the AM;  Take one 40mg  tablet by m outh in the PM 270 tablet 3  . glucose blood (ONE TOUCH ULTRA TEST) test strip Test blood sugar daily. Dx code: 250.00 100 each 2  . HYDROcodone-acetaminophen (NORCO/VICODIN) 5-325 MG per tablet Take one tablet at night as needed for pain 30 tablet 0  . lisinopril (PRINIVIL,ZESTRIL) 10 MG tablet Take 1 tablet (10 mg total) by mouth daily. 90 tablet 3  . metFORMIN (GLUCOPHAGE-XR) 500 MG 24 hr tablet Take 2 pills (1000mg ) twice daily 360 tablet 3  . metoprolol (LOPRESSOR) 100 MG tablet Take 1 tablet (100 mg total) by mouth 2 (two) times daily. 180 tablet 3  . omeprazole (PRILOSEC) 20 MG capsule Take 1 capsule (20 mg total) by mouth daily. 90 capsule 3  . potassium chloride SA (K-DUR,KLOR-CON) 20 MEQ tablet Take 1 tablet (20 mEq  total) by mouth 2 (two) times daily. 180 tablet 3  . rosuvastatin (CRESTOR) 20 MG tablet Take 1 tablet (20 mg total) by mouth daily. 90 tablet 3  . silver sulfADIAZINE (SILVADENE) 1 % cream Apply 1 application topically 2 (two) times daily. 400 g 0  . sitaGLIPtin (JANUVIA) 100 MG tablet Take 1 tablet (100 mg total) by mouth daily. 90 tablet 3  . triamcinolone cream (KENALOG) 0.1 % Apply topically 2 (two) times daily. 480 g 0  . triamterene-hydrochlorothiazide (MAXZIDE-25) 37.5-25 MG per tablet Take 1 tablet by mouth daily. 90 tablet 3   No current facility-administered medications for this visit.    Allergies:   Penicillins    Social History:  The patient  reports that he quit smoking about 12 years ago. His smoking use included Cigarettes. He has a 30 pack-year smoking history. He has never used smokeless tobacco. He reports that he drinks alcohol. He reports that he does  not use illicit drugs.   Family History:  The patient's family history includes Cancer in his mother.    ROS:  Please see the history of present illness.    Review of Systems: Constitutional:  denies fever, chills, diaphoresis, appetite change and fatigue.  HEENT: denies photophobia, eye pain, redness, hearing loss, ear pain, congestion, sore throat, rhinorrhea, sneezing, neck pain, neck stiffness and tinnitus.  Respiratory: admits to SOB, DOE, , , no wheezing or cough  Cardiovascular: denies chest pain, palpitations.  He has chronic severe  leg swelling.  Gastrointestinal: denies nausea, vomiting, abdominal pain, diarrhea, constipation, blood in stool.  Genitourinary: denies dysuria, urgency, frequency, hematuria, flank pain and difficulty urinating.  Musculoskeletal: admits to  myalgias, back pain, joint swelling, arthralgias and gait problem.   Skin: denies pallor, rash and wound.  Neurological: denies dizziness, seizures, syncope, weakness, light-headedness, numbness and headaches.   Hematological: denies adenopathy, easy bruising, personal or family bleeding history.  Psychiatric/ Behavioral: denies suicidal ideation, mood changes, confusion, nervousness, sleep disturbance and agitation.       All other systems are reviewed and negative.    PHYSICAL EXAM: VS:  BP 160/82 mmHg  Pulse 85  Ht 5\' 9"  (1.753 m)  Wt 237 lb 1.9 oz (107.557 kg)  BMI 35.00 kg/m2 , BMI Body mass index is 35 kg/(m^2). GEN: Well nourished, well developed, in no acute distress HEENT: normal Neck: no JVD, carotid bruits, or masses Cardiac: RRR; no murmurs, rubs, or gallops, 2-3 +  Leg edema with chronic stasis changes . scaley  Respiratory:  clear to auscultation bilaterally, normal work of breathing GI: soft, nontender, nondistended, + BS MS: no deformity or atrophy Skin: warm and dry, no rash Neuro:  Strength and sensation are intact Psych: normal   EKG:  EKG is ordered today. The ekg ordered  today demonstrates NSR  At 85. RBBB, LAHB    Recent Labs: 07/07/2014: ALT 9; TSH 2.251 07/15/2014: BUN 18; Creatinine 0.73; Potassium 4.4; Sodium 140 07/24/2014: Hemoglobin 12.3*    Lipid Panel    Component Value Date/Time   CHOL 106 07/07/2014 0844   TRIG 153* 07/07/2014 0844   HDL 31* 07/07/2014 0844   CHOLHDL 3.4 07/07/2014 0844   VLDL 31 07/07/2014 0844   LDLCALC 44 07/07/2014 0844      Wt Readings from Last 3 Encounters:  07/31/14 237 lb 1.9 oz (107.557 kg)  07/24/14 234 lb (106.142 kg)  07/15/14 234 lb (106.142 kg)      Other studies Reviewed: Additional studies/ records that were reviewed  today include: . Review of the above records demonstrates:    ASSESSMENT AND PLAN:  1.  Congestive heart failure: The patient presents with symptoms consistent with congestive heart failure. We do not have an echo cardiaciogram assistant to determine whether he has systolic or diastolic heart failure. We will get an echocardiogram. I've advised him to limit his salt intake. He's had chronic leg edema for many years. He discovered that if he rides a stationary bike and spends list, computer that his leg edema is improved.  He admits to eating lots of salt. I've advised him to cut back on his salt intake. Will have him elevate his legs regularly.    2. Hypertension: We will increase his lisinopril to 20 mg a day. We'll check a basic metabolic profile in the next several weeks. He'll be seeing his medical doctor closed that time and he can have his blood work checked at Dr. Perfecto Kingdom  office.  3. Diabetes mellitus: His glucose levels are fairly well-controlled.  4. Coronary artery disease: He status post stenting any years ago by Dr. Melvern Banker. He's not having any symptoms of angina.  5. Hyperlipidemia: His lipids are well controlled. Continue current medications.  Current medicines are reviewed at length with the patient today.  The patient does not have concerns regarding  medicines.  The following changes have been made:   Increase Lisinopril to 20 a day.   Labs/ tests ordered today include:  No orders of the defined types were placed in this encounter.     Disposition:   FU with me in 3 months     Signed, Nahser, Wonda Cheng, MD  07/31/2014 9:27 AM    [facility] [place], [place], [place]  [address] Phone: [phone]; Fax: [phone]

## 2014-07-31 NOTE — Patient Instructions (Addendum)
Your physician has requested that you have an echocardiogram. Echocardiography is a painless test that uses sound waves to create images of your heart. It provides your doctor with information about the size and shape of your heart and how well your heart's chambers and valves are working. This procedure takes approximately one hour. There are no restrictions for this procedure.  Your physician has recommended you make the following change in your medication:  INCREASE Lisinopril to 20 mg once daily  Your physician recommends that you return for lab work in: 2-3 weeks for Basic Metabolic Panel (you do not have to fast for this appointment) - may be done at Dr. Perfecto Kingdom office, fax results to Dr. Acie Fredrickson at [phone]  Your physician recommends that you schedule a follow-up appointment in: 3 months with Dr. Acie Fredrickson Your physician recommends that you have lab work on the same day as your appointment with Dr. Acie Fredrickson (you do not have to fast for this appointment)   For your  leg edema you  should do  the following 1. Leg elevation - I recommend the Lounge Dr. Leg rest.  See below for details  2. Salt restriction  -  Use potassium chloride instead of regular salt as a salt substitute. 3. Walk regularly 4. Compression hose - [facility] 5. Weight loss     Go to [email or web address]

## 2014-08-02 ENCOUNTER — Ambulatory Visit (HOSPITAL_COMMUNITY): Payer: Medicare HMO | Attending: Cardiology

## 2014-08-02 ENCOUNTER — Other Ambulatory Visit: Payer: Self-pay | Admitting: Emergency Medicine

## 2014-08-02 DIAGNOSIS — Z87891 Personal history of nicotine dependence: Secondary | ICD-10-CM | POA: Diagnosis not present

## 2014-08-02 DIAGNOSIS — I25810 Atherosclerosis of coronary artery bypass graft(s) without angina pectoris: Secondary | ICD-10-CM

## 2014-08-02 DIAGNOSIS — E119 Type 2 diabetes mellitus without complications: Secondary | ICD-10-CM | POA: Insufficient documentation

## 2014-08-02 DIAGNOSIS — I10 Essential (primary) hypertension: Secondary | ICD-10-CM | POA: Diagnosis not present

## 2014-08-02 DIAGNOSIS — E785 Hyperlipidemia, unspecified: Secondary | ICD-10-CM | POA: Insufficient documentation

## 2014-08-02 DIAGNOSIS — R0600 Dyspnea, unspecified: Secondary | ICD-10-CM | POA: Diagnosis not present

## 2014-08-02 NOTE — Progress Notes (Signed)
2D Echo completed. 08/02/2014

## 2014-08-05 ENCOUNTER — Telehealth: Payer: Self-pay | Admitting: Hematology

## 2014-08-05 NOTE — Telephone Encounter (Signed)
pt's wife confirmed appt 08/29/14 at 1:30pm w/Feng [id number], low hemoglobin, high platelet count Referring Dr. Rhea Pink- [facility]

## 2014-08-17 ENCOUNTER — Ambulatory Visit (INDEPENDENT_AMBULATORY_CARE_PROVIDER_SITE_OTHER): Payer: Medicare HMO | Admitting: Emergency Medicine

## 2014-08-17 ENCOUNTER — Ambulatory Visit: Payer: Medicare HMO

## 2014-08-17 ENCOUNTER — Ambulatory Visit (INDEPENDENT_AMBULATORY_CARE_PROVIDER_SITE_OTHER): Payer: Medicare HMO

## 2014-08-17 VITALS — BP 150/80 | HR 84 | Temp 98.0°F | Resp 18 | Ht 69.0 in | Wt 236.0 lb

## 2014-08-17 DIAGNOSIS — L97509 Non-pressure chronic ulcer of other part of unspecified foot with unspecified severity: Secondary | ICD-10-CM

## 2014-08-17 DIAGNOSIS — I25810 Atherosclerosis of coronary artery bypass graft(s) without angina pectoris: Secondary | ICD-10-CM

## 2014-08-17 DIAGNOSIS — I8312 Varicose veins of left lower extremity with inflammation: Secondary | ICD-10-CM

## 2014-08-17 DIAGNOSIS — I8311 Varicose veins of right lower extremity with inflammation: Secondary | ICD-10-CM

## 2014-08-17 DIAGNOSIS — E13621 Other specified diabetes mellitus with foot ulcer: Secondary | ICD-10-CM

## 2014-08-17 DIAGNOSIS — E119 Type 2 diabetes mellitus without complications: Secondary | ICD-10-CM

## 2014-08-17 DIAGNOSIS — R609 Edema, unspecified: Secondary | ICD-10-CM

## 2014-08-17 DIAGNOSIS — G629 Polyneuropathy, unspecified: Secondary | ICD-10-CM

## 2014-08-17 DIAGNOSIS — I872 Venous insufficiency (chronic) (peripheral): Secondary | ICD-10-CM

## 2014-08-17 LAB — BASIC METABOLIC PANEL
BUN: 16 mg/dL (ref 6–23)
CHLORIDE: 95 meq/L — AB (ref 96–112)
CO2: 32 mEq/L (ref 19–32)
Calcium: 9.1 mg/dL (ref 8.4–10.5)
Creat: 0.75 mg/dL (ref 0.50–1.35)
Glucose, Bld: 122 mg/dL — ABNORMAL HIGH (ref 70–99)
Potassium: 4.1 mEq/L (ref 3.5–5.3)
SODIUM: 136 meq/L (ref 135–145)

## 2014-08-17 LAB — POCT CBC
Granulocyte percent: 90.6 %G — AB (ref 37–80)
HCT, POC: 38.4 % — AB (ref 43.5–53.7)
HEMOGLOBIN: 11.9 g/dL — AB (ref 14.1–18.1)
Lymph, poc: 1 (ref 0.6–3.4)
MCH, POC: 22.1 pg — AB (ref 27–31.2)
MCHC: 31 g/dL — AB (ref 31.8–35.4)
MCV: 71.2 fL — AB (ref 80–97)
MID (cbc): 0.8 (ref 0–0.9)
MPV: 7.4 fL (ref 0–99.8)
POC Granulocyte: 17.6 — AB (ref 2–6.9)
POC LYMPH PERCENT: 5.4 %L — AB (ref 10–50)
POC MID %: 4 % (ref 0–12)
Platelet Count, POC: 629 10*3/uL — AB (ref 142–424)
RBC: 5.39 M/uL (ref 4.69–6.13)
RDW, POC: 20.8 %
WBC: 19.4 10*3/uL — AB (ref 4.6–10.2)

## 2014-08-17 LAB — GLUCOSE, POCT (MANUAL RESULT ENTRY): POC Glucose: 140 mg/dl — AB (ref 70–99)

## 2014-08-17 LAB — VITAMIN B12: VITAMIN B 12: 340 pg/mL (ref 211–911)

## 2014-08-17 MED ORDER — DOXYCYCLINE HYCLATE 100 MG PO CAPS: 100.0000 mg | ORAL_CAPSULE | Freq: Two times a day (BID) | ORAL | Status: DC

## 2014-08-17 MED ORDER — ARTIFICIAL TEARS OP OINT: TOPICAL_OINTMENT | OPHTHALMIC | Status: DC | PRN

## 2014-08-17 NOTE — Progress Notes (Addendum)
Subjective:  This chart was scribed for [name], MD by [name], ED Scribe at [facility].The patient was seen in exam room 08 and the patient's care was started at 9:13 AM.   Patient ID: [name], [name]    DOB: 05-31-1941, 74 y.o.   MRN: [id number] Chief Complaint  Patient presents with  . Follow-up    Open sores on bilateral legs   HPI HPI Comments: [name] is a 74 y.o. [name] with a history of neuropathy, CAD, HTN, and HLD who presents to [facility] for a follow up for infected calluses over the balls of both feet. Pt states they have appeared recently. He has done nothing to improve his symptoms. Pt states he has had more feeling since riding his bike. He was seen by Dr. Cathie Olden and told he his heart had "no problems". Pt states he feels much better now that he is riding his bike regularly. His swelling is going down, but not as fast as he would like. The cardiologist doubled his lisinopril dosage. He still has been bruising easily. He also has a small mass on his left arm that he picks at constantly.    Patient Active Problem List   Diagnosis Date Noted  . Compression fracture of body of thoracic vertebra 07/07/2014  . 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 Gallstones 06/09/2012  . Liver mass 06/09/2012  . Right sided abdominal pain 05/23/2012  . DM (diabetes mellitus) 09/01/2011  . CAD (coronary artery disease) 09/01/2011  . Hypertension 09/01/2011  . Hyperlipidemia 09/01/2011   Past Medical History  Diagnosis Date  . Allergy   . Arthritis   . Diabetes mellitus without complication   . Hypertension   . CHF (congestive heart failure)   . GERD (gastroesophageal reflux disease)   . Hyperlipidemia    Past Surgical History  Procedure Laterality Date  . Joint replacement    . Spine surgery    . Heart stent  2002   Allergies  Allergen Reactions  . Penicillins Shortness Of Breath   Prior to Admission medications   Medication Sig Start Date  End Date Taking? Authorizing Provider  albuterol (PROVENTIL HFA;VENTOLIN HFA) 108 (90 BASE) MCG/ACT inhaler Inhale 2 puffs into the lungs every 4 (four) hours as needed for wheezing or shortness of breath (cough, shortness of breath or wheezing.). 07/07/14  Yes Darlyne Russian, MD  Coenzyme Q10 (CO Q 10) 100 MG CAPS Take 400 mg by mouth daily.   Yes Historical Provider, MD  ferrous sulfate 325 (65 FE) MG tablet Take 1 tablet (325 mg total) by mouth 2 (two) times daily. 07/07/14  Yes Darlyne Russian, MD  furosemide (LASIX) 40 MG tablet Take two 40 mg tablets by mouth in the AM;  Take one 40mg  tablet by m outh in the PM 07/07/14  Yes Darlyne Russian, MD  glucose blood (ONE TOUCH ULTRA TEST) test strip Test 1-2 times daily as directed 08/02/14  Yes Chelle S Jeffery, PA-C  lisinopril (PRINIVIL,ZESTRIL) 20 MG tablet Take 1 tablet (20 mg total) by mouth daily. Patient taking differently: Take 20 mg by mouth daily. Take two tablets once twice a day 07/31/14  Yes Thayer Headings, MD  metFORMIN (GLUCOPHAGE-XR) 500 MG 24 hr tablet Take 2 pills (1000mg ) twice daily 07/09/14  Yes Darlyne Russian, MD  metoprolol (LOPRESSOR) 100 MG tablet Take 1 tablet (100 mg total) by mouth 2 (two) times daily. 07/09/14  Yes Remo Lipps  A Daub, MD  omeprazole (PRILOSEC) 20 MG capsule Take 1 capsule (20 mg total) by mouth daily. 07/07/14  Yes Darlyne Russian, MD  potassium chloride SA (K-DUR,KLOR-CON) 20 MEQ tablet Take 1 tablet (20 mEq total) by mouth 2 (two) times daily. 07/07/14  Yes Darlyne Russian, MD  rosuvastatin (CRESTOR) 20 MG tablet Take 1 tablet (20 mg total) by mouth daily. 07/07/14  Yes Darlyne Russian, MD  sitaGLIPtin (JANUVIA) 100 MG tablet Take 1 tablet (100 mg total) by mouth daily. 07/09/14  Yes Darlyne Russian, MD  triamcinolone cream (KENALOG) 0.1 % Apply topically 2 (two) times daily. 07/07/14  Yes Darlyne Russian, MD  triamterene-hydrochlorothiazide (MAXZIDE-25) 37.5-25 MG per tablet Take 1 tablet by mouth daily. 07/09/14  Yes Darlyne Russian, MD    HYDROcodone-acetaminophen (NORCO/VICODIN) 5-325 MG per tablet Take one tablet at night as needed for pain Patient not taking: Reported on 08/17/2014 07/24/14   Darlyne Russian, MD  silver sulfADIAZINE (SILVADENE) 1 % cream Apply 1 application topically 2 (two) times daily. Patient not taking: Reported on 08/17/2014 07/07/14   Darlyne Russian, MD   History   Social History  . Marital Status: Married    Spouse Name: N/A  . Number of Children: N/A  . Years of Education: N/A   Occupational History  . Not on file.   Social History Main Topics  . Smoking status: Former Smoker -- 2.00 packs/day for 15 years    Types: Cigarettes    Quit date: 08/31/2001  . Smokeless tobacco: Never Used  . Alcohol Use: Yes     Comment: occ  . Drug Use: No  . Sexual Activity: Not on file   Other Topics Concern  . Not on file   Social History Narrative   Review of Systems  Cardiovascular: Positive for leg swelling.  Skin: Positive for color change and wound.  Neurological: Positive for numbness.  Hematological: Bruises/bleeds easily.       Objective:  BP 150/80 mmHg  Pulse 84  Temp(Src) 98 F (36.7 C) (Oral)  Resp 18  Ht 5\' 9"  (1.753 m)  Wt 236 lb (107.049 kg)  BMI 34.84 kg/m2  SpO2 96%  Physical Exam  Constitutional: He is oriented to person, place, and time. He appears well-developed and well-nourished. No distress.  Somewhat disheveled, copperative  HENT:  Head: Normocephalic and atraumatic.  Eyes: Pupils are equal, round, and reactive to light.  Neck: Normal range of motion.  Cardiovascular: Normal rate, regular rhythm and normal heart sounds.   Pulmonary/Chest: Effort normal and breath sounds normal. No respiratory distress.  Musculoskeletal: Normal range of motion.  3+ edema of both legs, there is severe scaling and redness of both lower extremities. There is thickened 3 by 3 cm callus like areas over the balls of both feet. There is a purulent odor coming from the right callus. There  is also a 1 x 2 cm deep ulcer present over the distal left fifth metatarsal  Neurological: He is alert and oriented to person, place, and time.  Skin: Skin is warm and dry.  Psychiatric: He has a normal mood and affect. His behavior is normal.  Nursing note and vitals reviewed. [facility] reading (PRIMARY) by  Dr.Daub examination the right foot reveal some small cystic areas distal first metatarsal on the left foot there are degenerative changes. 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 Referral made to by dietary. Prescription given for diabetic shoes. He will clean the areas with soap and water followed by application of Silvadene ointment followed by treatment with doxycycline twice a day.scribe I told the patient if we did did not get this problem under control there is a good chance he would lose his feet.

## 2014-08-17 NOTE — Patient Instructions (Signed)
Diabetic Neuropathy Diabetic neuropathy is a nerve disease or nerve damage that is caused by diabetes mellitus. About half of all people with diabetes mellitus have some form of nerve damage. Nerve damage is more common in those who have had diabetes mellitus for many years and who generally have not had good control of their blood sugar (glucose) level. Diabetic neuropathy is a common complication of diabetes mellitus. There are three more common types of diabetic neuropathy and a fourth type that is less common and less understood:   Peripheral neuropathy--This is the most common type of diabetic neuropathy. It causes damage to the nerves of the feet and legs first and then eventually the hands and arms.The damage affects the ability to sense touch.  Autonomic neuropathy--This type causes damage to the autonomic nervous system, which controls the following functions:  Heartbeat.  Body temperature.  Blood pressure.  Urination.  Digestion.  Sweating.  Sexual function.  Focal neuropathy--Focal neuropathy can be painful and unpredictable and occurs most often in older adults with diabetes mellitus. It involves a specific nerve or one area and often comes on suddenly. It usually does not cause long-term problems.  Radiculoplexus neuropathy-- Sometimes called lumbosacral radiculoplexus neuropathy, radiculoplexus neuropathy affects the nerves of the thighs, hips, buttocks, or legs. It is more common in people with type 2 diabetes mellitus and in older men. It is characterized by debilitating pain, weakness, and atrophy, usually in the thigh muscles. CAUSES  The cause of peripheral, autonomic, and focal neuropathies is diabetes mellitus that is uncontrolled and high glucose levels. The cause of radiculoplexus neuropathy is unknown. However, it is thought to be caused by inflammation related to uncontrolled glucose levels. SIGNS AND SYMPTOMS  Peripheral Neuropathy Peripheral neuropathy develops  slowly over time. When the nerves of the feet and legs no longer work there may be:   Burning, stabbing, or aching pain in the legs or feet.  Inability to feel pressure or pain in your feet. This can lead to:  Thick calluses over pressure areas.  Pressure sores.  Ulcers.  Foot deformities.  Reduced ability to feel temperature changes.  Muscle weakness. Autonomic Neuropathy The symptoms of autonomic neuropathy vary depending on which nerves are affected. Symptoms may include:  Problems with digestion, such as:  Feeling sick to your stomach (nausea).  Vomiting.  Bloating.  Constipation.  Diarrhea.  Abdominal pain.  Difficulty with urination. This occurs if you lose your ability to sense when your bladder is full. Problems include:  Urine leakage (incontinence).  Inability to empty your bladder completely (retention).  Rapid or irregular heartbeat (palpitations).  Blood pressure drops when you stand up (orthostatic hypotension). When you stand up you may feel:  Dizzy.  Weak.  Faint.  In men, inability to attain and maintain an erection.  In women, vaginal dryness and problems with decreased sexual desire and arousal.  Problems with body temperature regulation.  Increased or decreased sweating. Focal Neuropathy  Abnormal eye movements or abnormal alignment of both eyes.  Weakness in the wrist.  Foot drop. This results in an inability to lift the foot properly and abnormal walking or foot movement.  Paralysis on one side of your face (Bell palsy).  Chest or abdominal pain. Radiculoplexus Neuropathy  Sudden, severe pain in your hip, thigh, or buttocks.  Weakness and wasting of thigh muscles.  Difficulty rising from a seated position.  Abdominal swelling.  Unexplained weight loss (usually more than 10 lb [4.5 kg]). DIAGNOSIS  Peripheral Neuropathy Your senses may   be tested. Sensory function testing can be done with:  A light touch using a  monofilament.  A vibration with tuning fork.  A sharp sensation with a pin prick. Other tests that can help diagnose neuropathy are:  Nerve conduction velocity. This test checks the transmission of an electrical current through a nerve.  Electromyography. This shows how muscles respond to electrical signals transmitted by nearby nerves.  Quantitative sensory testing. This is used to assess how your nerves respond to vibrations and changes in temperature. Autonomic Neuropathy Diagnosis is often based on reported symptoms. Tell your health care provider if you experience:   Dizziness.   Constipation.   Diarrhea.   Inappropriate urination or inability to urinate.   Inability to get or maintain an erection.  Tests that may be done include:   Electrocardiography or Holter monitor. These are tests that can help show problems with the heart rate or heart rhythm.   An X-ray exam may be done. Focal Neuropathy Diagnosis is made based on your symptoms and what your health care provider finds during your exam. Other tests may be done. They may include:  Nerve conduction velocities. This checks the transmission of electrical current through a nerve.  Electromyography. This shows how muscles respond to electrical signals transmitted by nearby nerves.  Quantitative sensory testing. This test is used to assess how your nerves respond to vibration and changes in temperature. Radiculoplexus Neuropathy  Often the first thing is to eliminate any other issue or problems that might be the cause, as there is no stick test for diagnosis.  X-ray exam of your spine and lumbar region.  Spinal tap to rule out cancer.  MRI to rule out other lesions. TREATMENT  Once nerve damage occurs, it cannot be reversed. The goal of treatment is to keep the disease or nerve damage from getting worse and affecting more nerve fibers. Controlling your blood glucose level is the key. Most people with  radiculoplexus neuropathy see at least a partial improvement over time. You will need to keep your blood glucose and HbA1c levels in the target range determined by your health care provider. Things that help control blood glucose levels include:   Blood glucose monitoring.   Meal planning.   Physical activity.   Diabetes medicine.  Over time, maintaining lower blood glucose levels helps lessen symptoms. Sometimes, prescription pain medicine is needed. HOME CARE INSTRUCTIONS:  Do not smoke.  Keep your blood glucose level in the range that you and your health care provider have determined acceptable for you.  Keep your blood pressure level in the range that you and your health care provider have determined acceptable for you.  Eat a well-balanced diet.  Be active every day.  Check your feet every day. SEEK MEDICAL CARE IF:   You have burning, stabbing, or aching pain in the legs or feet.  You are unable to feel pressure or pain in your feet.  You develop problems with digestion such as:  Nausea.  Vomiting.  Bloating.  Constipation.  Diarrhea.  Abdominal pain.  You have difficulty with urination, such as:  Incontinence.  Retention.  You have palpitations.  You develop orthostatic hypotension. When you stand up you may feel:  Dizzy.  Weak.  Faint.  You cannot attain and maintain an erection (in men).  You have vaginal dryness and problems with decreased sexual desire and arousal (in women).  You have severe pain in your thighs, legs, or buttocks.  You have unexplained weight loss.   Document Released: 08/23/2001 Document Revised: 04/04/2013 Document Reviewed: 11/23/2012 [employer] Patient Information 2015 [employer]. This information is not intended to replace advice given to you by your health care provider. Make sure you discuss any questions you have with your health care provider. 

## 2014-08-19 LAB — WOUND CULTURE
Gram Stain: NONE SEEN
Gram Stain: NONE SEEN

## 2014-08-19 NOTE — Addendum Note (Signed)
Addended by: [name] A on: 08/19/2014 09:23 AM   Modules accepted: Orders

## 2014-08-20 ENCOUNTER — Encounter (HOSPITAL_COMMUNITY): Payer: Self-pay | Admitting: *Deleted

## 2014-08-20 ENCOUNTER — Inpatient Hospital Stay (HOSPITAL_COMMUNITY)
Admission: EM | Admit: 2014-08-20 | Discharge: 2014-08-24 | DRG: 638 | Disposition: A | Discharge status: Home / Self Care | Payer: Medicare HMO | Attending: Internal Medicine | Admitting: Internal Medicine

## 2014-08-20 ENCOUNTER — Telehealth: Payer: Self-pay | Admitting: Emergency Medicine

## 2014-08-20 ENCOUNTER — Inpatient Hospital Stay (HOSPITAL_COMMUNITY): Payer: Medicare HMO

## 2014-08-20 ENCOUNTER — Ambulatory Visit (INDEPENDENT_AMBULATORY_CARE_PROVIDER_SITE_OTHER): Payer: Medicare HMO | Admitting: Emergency Medicine

## 2014-08-20 ENCOUNTER — Emergency Department (HOSPITAL_COMMUNITY): Payer: Medicare HMO

## 2014-08-20 VITALS — BP 146/73 | HR 86 | Temp 97.9°F | Resp 16 | Ht 69.0 in | Wt 230.0 lb

## 2014-08-20 DIAGNOSIS — Z87891 Personal history of nicotine dependence: Secondary | ICD-10-CM | POA: Diagnosis not present

## 2014-08-20 DIAGNOSIS — M1990 Unspecified osteoarthritis, unspecified site: Secondary | ICD-10-CM | POA: Diagnosis present

## 2014-08-20 DIAGNOSIS — L03116 Cellulitis of left lower limb: Secondary | ICD-10-CM | POA: Diagnosis present

## 2014-08-20 DIAGNOSIS — I2510 Atherosclerotic heart disease of native coronary artery without angina pectoris: Secondary | ICD-10-CM | POA: Diagnosis present

## 2014-08-20 DIAGNOSIS — Z9660 Presence of unspecified orthopedic joint implant: Secondary | ICD-10-CM | POA: Diagnosis present

## 2014-08-20 DIAGNOSIS — D72829 Elevated white blood cell count, unspecified: Secondary | ICD-10-CM | POA: Diagnosis present

## 2014-08-20 DIAGNOSIS — G629 Polyneuropathy, unspecified: Secondary | ICD-10-CM

## 2014-08-20 DIAGNOSIS — K219 Gastro-esophageal reflux disease without esophagitis: Secondary | ICD-10-CM | POA: Diagnosis present

## 2014-08-20 DIAGNOSIS — M869 Osteomyelitis, unspecified: Secondary | ICD-10-CM

## 2014-08-20 DIAGNOSIS — I509 Heart failure, unspecified: Secondary | ICD-10-CM | POA: Diagnosis present

## 2014-08-20 DIAGNOSIS — E785 Hyperlipidemia, unspecified: Secondary | ICD-10-CM | POA: Diagnosis present

## 2014-08-20 DIAGNOSIS — E11628 Type 2 diabetes mellitus with other skin complications: Secondary | ICD-10-CM | POA: Diagnosis present

## 2014-08-20 DIAGNOSIS — Z79899 Other long term (current) drug therapy: Secondary | ICD-10-CM

## 2014-08-20 DIAGNOSIS — M79673 Pain in unspecified foot: Secondary | ICD-10-CM | POA: Diagnosis present

## 2014-08-20 DIAGNOSIS — I10 Essential (primary) hypertension: Secondary | ICD-10-CM | POA: Diagnosis present

## 2014-08-20 DIAGNOSIS — M79671 Pain in right foot: Secondary | ICD-10-CM

## 2014-08-20 DIAGNOSIS — Z955 Presence of coronary angioplasty implant and graft: Secondary | ICD-10-CM | POA: Diagnosis not present

## 2014-08-20 DIAGNOSIS — L97529 Non-pressure chronic ulcer of other part of left foot with unspecified severity: Secondary | ICD-10-CM | POA: Diagnosis present

## 2014-08-20 DIAGNOSIS — E1142 Type 2 diabetes mellitus with diabetic polyneuropathy: Secondary | ICD-10-CM | POA: Diagnosis present

## 2014-08-20 DIAGNOSIS — L97519 Non-pressure chronic ulcer of other part of right foot with unspecified severity: Secondary | ICD-10-CM | POA: Diagnosis present

## 2014-08-20 DIAGNOSIS — E1169 Type 2 diabetes mellitus with other specified complication: Secondary | ICD-10-CM

## 2014-08-20 DIAGNOSIS — L089 Local infection of the skin and subcutaneous tissue, unspecified: Secondary | ICD-10-CM | POA: Diagnosis present

## 2014-08-20 DIAGNOSIS — Z880 Allergy status to penicillin: Secondary | ICD-10-CM

## 2014-08-20 DIAGNOSIS — E119 Type 2 diabetes mellitus without complications: Secondary | ICD-10-CM

## 2014-08-20 DIAGNOSIS — E11621 Type 2 diabetes mellitus with foot ulcer: Principal | ICD-10-CM | POA: Diagnosis present

## 2014-08-20 DIAGNOSIS — M79672 Pain in left foot: Secondary | ICD-10-CM

## 2014-08-20 LAB — CBC WITH DIFFERENTIAL/PLATELET
BASOS PCT: 0 % (ref 0–1)
Basophils Absolute: 0.1 10*3/uL (ref 0.0–0.1)
EOS ABS: 0.3 10*3/uL (ref 0.0–0.7)
EOS PCT: 2 % (ref 0–5)
HCT: 41 % (ref 39.0–52.0)
Hemoglobin: 11.9 g/dL — ABNORMAL LOW (ref 13.0–17.0)
LYMPHS ABS: 0.6 10*3/uL — AB (ref 0.7–4.0)
Lymphocytes Relative: 3 % — ABNORMAL LOW (ref 12–46)
MCH: 22.3 pg — ABNORMAL LOW (ref 26.0–34.0)
MCHC: 29 g/dL — AB (ref 30.0–36.0)
MCV: 76.9 fL — ABNORMAL LOW (ref 78.0–100.0)
Monocytes Absolute: 0.7 10*3/uL (ref 0.1–1.0)
Monocytes Relative: 4 % (ref 3–12)
NEUTROS PCT: 91 % — AB (ref 43–77)
Neutro Abs: 17.4 10*3/uL — ABNORMAL HIGH (ref 1.7–7.7)
PLATELETS: 630 10*3/uL — AB (ref 150–400)
RBC: 5.33 MIL/uL (ref 4.22–5.81)
RDW: 20 % — AB (ref 11.5–15.5)
WBC: 19.1 10*3/uL — ABNORMAL HIGH (ref 4.0–10.5)

## 2014-08-20 LAB — URINALYSIS, ROUTINE W REFLEX MICROSCOPIC
BILIRUBIN URINE: NEGATIVE
GLUCOSE, UA: NEGATIVE mg/dL
Hgb urine dipstick: NEGATIVE
Ketones, ur: NEGATIVE mg/dL
Leukocytes, UA: NEGATIVE
Nitrite: NEGATIVE
PROTEIN: NEGATIVE mg/dL
Specific Gravity, Urine: 1.012 (ref 1.005–1.030)
Urobilinogen, UA: 1 mg/dL (ref 0.0–1.0)
pH: 6.5 (ref 5.0–8.0)

## 2014-08-20 LAB — COMPREHENSIVE METABOLIC PANEL
ALK PHOS: 88 U/L (ref 39–117)
ALT: 9 U/L (ref 0–53)
AST: 25 U/L (ref 0–37)
Albumin: 3.9 g/dL (ref 3.5–5.2)
Anion gap: 13 (ref 5–15)
BILIRUBIN TOTAL: 1 mg/dL (ref 0.3–1.2)
BUN: 15 mg/dL (ref 6–23)
CHLORIDE: 96 mmol/L (ref 96–112)
CO2: 28 mmol/L (ref 19–32)
CREATININE: 0.76 mg/dL (ref 0.50–1.35)
Calcium: 9.3 mg/dL (ref 8.4–10.5)
GFR calc Af Amer: >90 mL/min (ref >90)
GFR, EST NON AFRICAN AMERICAN: 90 mL/min — AB (ref >90)
Glucose, Bld: 149 mg/dL — ABNORMAL HIGH (ref 70–99)
POTASSIUM: 4 mmol/L (ref 3.5–5.1)
SODIUM: 137 mmol/L (ref 135–145)
Total Protein: 7.1 g/dL (ref 6.0–8.3)

## 2014-08-20 LAB — GLUCOSE, CAPILLARY: GLUCOSE-CAPILLARY: 181 mg/dL — AB (ref 70–99)

## 2014-08-20 LAB — C-REACTIVE PROTEIN: CRP: 0.5 mg/dL — ABNORMAL LOW (ref <0.60)

## 2014-08-20 LAB — SEDIMENTATION RATE: Sed Rate: 13 mm/hr (ref 0–16)

## 2014-08-20 LAB — I-STAT CG4 LACTIC ACID, ED: LACTIC ACID, VENOUS: 3.25 mmol/L — AB (ref 0.5–2.0)

## 2014-08-20 LAB — CBG MONITORING, ED
GLUCOSE-CAPILLARY: 156 mg/dL — AB (ref 70–99)
Glucose-Capillary: 156 mg/dL — ABNORMAL HIGH (ref 70–99)

## 2014-08-20 MED ORDER — FERROUS SULFATE 325 (65 FE) MG PO TABS
325.0000 mg | ORAL_TABLET | Freq: Two times a day (BID) | ORAL | Status: DC
  Administered 2014-08-21 – 2014-08-23 (×6): 325 mg via ORAL
  Filled 2014-08-20 (×9): qty 1

## 2014-08-20 MED ORDER — METOPROLOL TARTRATE 100 MG PO TABS
100.0000 mg | ORAL_TABLET | Freq: Two times a day (BID) | ORAL | Status: DC
  Administered 2014-08-20 – 2014-08-24 (×8): 100 mg via ORAL
  Filled 2014-08-20 (×8): qty 1

## 2014-08-20 MED ORDER — ALBUTEROL SULFATE HFA 108 (90 BASE) MCG/ACT IN AERS
2.0000 | INHALATION_SPRAY | RESPIRATORY_TRACT | Status: DC | PRN
  Administered 2014-08-20: 2 via RESPIRATORY_TRACT
  Filled 2014-08-20: qty 6.7

## 2014-08-20 MED ORDER — VANCOMYCIN HCL IN DEXTROSE 1-5 GM/200ML-% IV SOLN
1000.0000 mg | Freq: Two times a day (BID) | INTRAVENOUS | Status: DC
Start: 2014-08-21 — End: 2014-08-24
  Administered 2014-08-21 – 2014-08-24 (×7): 1000 mg via INTRAVENOUS
  Filled 2014-08-20 (×8): qty 200

## 2014-08-20 MED ORDER — SODIUM CHLORIDE 0.9 % IV SOLN
INTRAVENOUS | Status: DC
  Administered 2014-08-20: 19:00:00 via INTRAVENOUS

## 2014-08-20 MED ORDER — ALBUTEROL SULFATE (2.5 MG/3ML) 0.083% IN NEBU
2.0000 mg | INHALATION_SOLUTION | RESPIRATORY_TRACT | Status: DC | PRN
  Administered 2014-08-20 – 2014-08-21 (×2): 2 mg via RESPIRATORY_TRACT
  Filled 2014-08-20: qty 3

## 2014-08-20 MED ORDER — INSULIN ASPART 100 UNIT/ML ~~LOC~~ SOLN
0.0000 [IU] | Freq: Three times a day (TID) | SUBCUTANEOUS | Status: DC
  Administered 2014-08-20 – 2014-08-21 (×3): 2 [IU] via SUBCUTANEOUS
  Administered 2014-08-21 – 2014-08-23 (×5): 1 [IU] via SUBCUTANEOUS
  Administered 2014-08-23: 2 [IU] via SUBCUTANEOUS
  Filled 2014-08-20: qty 1

## 2014-08-20 MED ORDER — METFORMIN HCL ER 500 MG PO TB24
1000.0000 mg | ORAL_TABLET | Freq: Two times a day (BID) | ORAL | Status: DC
  Administered 2014-08-21 – 2014-08-24 (×7): 1000 mg via ORAL
  Filled 2014-08-20 (×7): qty 2

## 2014-08-20 MED ORDER — HYDROCODONE-ACETAMINOPHEN 5-325 MG PO TABS
1.0000 | ORAL_TABLET | Freq: Four times a day (QID) | ORAL | Status: DC | PRN
  Administered 2014-08-20 – 2014-08-24 (×9): 1 via ORAL
  Filled 2014-08-20 (×9): qty 1

## 2014-08-20 MED ORDER — PANTOPRAZOLE SODIUM 40 MG PO TBEC
40.0000 mg | DELAYED_RELEASE_TABLET | Freq: Every day | ORAL | Status: DC
  Administered 2014-08-21 – 2014-08-24 (×4): 40 mg via ORAL
  Filled 2014-08-20 (×5): qty 1

## 2014-08-20 MED ORDER — FUROSEMIDE 80 MG PO TABS
80.0000 mg | ORAL_TABLET | Freq: Every day | ORAL | Status: DC
  Administered 2014-08-21 – 2014-08-24 (×4): 80 mg via ORAL
  Filled 2014-08-20 (×4): qty 1

## 2014-08-20 MED ORDER — MELATONIN 5 MG PO CAPS: 5.0000 mg | ORAL_CAPSULE | Freq: Every day | ORAL | Status: DC

## 2014-08-20 MED ORDER — ENOXAPARIN SODIUM 60 MG/0.6ML ~~LOC~~ SOLN
50.0000 mg | SUBCUTANEOUS | Status: DC
Start: 2014-08-20 — End: 2014-08-24
  Administered 2014-08-20 – 2014-08-23 (×4): 50 mg via SUBCUTANEOUS
  Filled 2014-08-20 (×5): qty 0.6

## 2014-08-20 MED ORDER — VANCOMYCIN HCL 10 G IV SOLR
2000.0000 mg | INTRAVENOUS | Status: AC
  Administered 2014-08-20: 2000 mg via INTRAVENOUS
  Filled 2014-08-20: qty 2000

## 2014-08-20 MED ORDER — FUROSEMIDE 40 MG PO TABS
40.0000 mg | ORAL_TABLET | Freq: Every day | ORAL | Status: DC
  Administered 2014-08-20 – 2014-08-23 (×4): 40 mg via ORAL
  Filled 2014-08-20 (×4): qty 1

## 2014-08-20 MED ORDER — FUROSEMIDE 40 MG PO TABS: 40.0000 mg | ORAL_TABLET | Freq: Two times a day (BID) | ORAL | Status: DC

## 2014-08-20 MED ORDER — LISINOPRIL 20 MG PO TABS
20.0000 mg | ORAL_TABLET | Freq: Every day | ORAL | Status: DC
  Administered 2014-08-21: 20 mg via ORAL
  Filled 2014-08-20: qty 1

## 2014-08-20 MED ORDER — LINAGLIPTIN 5 MG PO TABS
5.0000 mg | ORAL_TABLET | Freq: Every day | ORAL | Status: DC
  Administered 2014-08-21 – 2014-08-24 (×4): 5 mg via ORAL
  Filled 2014-08-20 (×4): qty 1

## 2014-08-20 MED ORDER — TRIAMTERENE-HCTZ 37.5-25 MG PO TABS
1.0000 | ORAL_TABLET | Freq: Every day | ORAL | Status: DC
  Administered 2014-08-21 – 2014-08-24 (×4): 1 via ORAL
  Filled 2014-08-20 (×4): qty 1

## 2014-08-20 MED ORDER — POTASSIUM CHLORIDE CRYS ER 20 MEQ PO TBCR
20.0000 meq | EXTENDED_RELEASE_TABLET | Freq: Two times a day (BID) | ORAL | Status: DC
  Administered 2014-08-20 – 2014-08-24 (×8): 20 meq via ORAL
  Filled 2014-08-20 (×10): qty 1

## 2014-08-20 MED ORDER — ROSUVASTATIN CALCIUM 20 MG PO TABS
20.0000 mg | ORAL_TABLET | Freq: Every day | ORAL | Status: DC
  Administered 2014-08-21 – 2014-08-24 (×4): 20 mg via ORAL
  Filled 2014-08-20 (×4): qty 1

## 2014-08-20 NOTE — Telephone Encounter (Addendum)
I received a phone call fromAudrey last night. I also spoke to [place]. I also spoke to his son Fatima Sanger. They are unable to care for his wounds on his feet. The wounds have a foul odor. His wife is unable to change the bandage. Patient is unable to reach his feet to clean them. He will need to be admitted for evaluation, antibiotics, and debridement of the infected areas of both feet. I did advise them of the risk of loss of his feet if we are not able to get this infection under control. Of note his culture grew multiple organisms  and his previous x-rays done Saturday did not show any signs of osteomyelitis however this is still in the differential. Because of the inability to clean these lesions and no response to oral antibiotics and negative culture we could be dealing with an anaerobe. He definitely has necrotic tissue present over the balls of both feet. I have advised him of the risk of losing his feet. He does understand the gravity of the situation

## 2014-08-20 NOTE — ED Notes (Signed)
CBG was already done

## 2014-08-20 NOTE — ED Notes (Signed)
Attempted to call report first time. Nurse to call back.

## 2014-08-20 NOTE — ED Notes (Signed)
Dr Osvaldo Angst from urgent care sending pt, pt hx DM and neuropathy. Had large ulcers on both undersides of feet. 2 ulcers on left feet which are not as bad. Right ulcer on bottom of foot necrotic wound. Pt had culture- came back with multiple organisms. Wounds need to be debrided. Dr Osvaldo Angst unable to get pt into wound center, unable to direct admit to [facility]. Pt being sent to ED.

## 2014-08-20 NOTE — ED Notes (Signed)
Pt requesting from pharm tech to take home meds, CoQ10, melatonin, albuterol inhaler and kenalog creamto get while in Hospital.

## 2014-08-20 NOTE — ED Notes (Signed)
[facility] called for transport. Report given to [facility] nurse who was made aware of pending MRI.

## 2014-08-20 NOTE — ED Notes (Signed)
Pt presents to ED with 3 pressure ulcers. One ulcer to bottom of right foot at right side of ball of foot. Two ulcers on left bottom of foot. One on left ball of foot towards left outer edge, one on right side of ball of foot to right outer side.

## 2014-08-20 NOTE — ED Provider Notes (Signed)
CSN: [id number]     Arrival date & time 08/20/14  1209 History   First MD Initiated Contact with Patient 08/20/14 1332     Chief Complaint  Patient presents with  . diabetic ulcers, need debriding from pcp      (Consider location/radiation/quality/duration/timing/severity/associated sxs/prior Treatment) Patient is a 74 y.o. male presenting with lower extremity pain.  Foot Pain This is a chronic problem. Episode onset: 2 weeks ago. The problem occurs constantly. The problem has been gradually worsening. Pertinent negatives include no chest pain, no abdominal pain, no headaches and no shortness of breath. The symptoms are aggravated by standing. Nothing relieves the symptoms.    Past Medical History  Diagnosis Date  . Allergy   . Arthritis   . Diabetes mellitus without complication   . Hypertension   . CHF (congestive heart failure)   . GERD (gastroesophageal reflux disease)   . Hyperlipidemia    Past Surgical History  Procedure Laterality Date  . Joint replacement    . Spine surgery    . Heart stent  2002   Family History  Problem Relation Age of Onset  . Cancer Mother     breast   History  Substance Use Topics  . Smoking status: Former Smoker -- 2.00 packs/day for 15 years    Types: Cigarettes    Quit date: 08/31/2001  . Smokeless tobacco: Never Used  . Alcohol Use: Yes     Comment: occ    Review of Systems  Respiratory: Negative for shortness of breath.   Cardiovascular: Negative for chest pain.  Gastrointestinal: Negative for abdominal pain.  Neurological: Negative for headaches.  All other systems reviewed and are negative.     Allergies  Penicillins  Home Medications   Prior to Admission medications   Medication Sig Start Date End Date Taking? Authorizing Provider  albuterol (PROVENTIL HFA;VENTOLIN HFA) 108 (90 BASE) MCG/ACT inhaler Inhale 2 puffs into the lungs every 4 (four) hours as needed for wheezing or shortness of breath (cough, shortness of  breath or wheezing.). 07/07/14  Yes Darlyne Russian, MD  Coenzyme Q10 (CO Q 10) 100 MG CAPS Take 400 mg by mouth daily.   Yes Historical Provider, MD  doxycycline (VIBRAMYCIN) 100 MG capsule Take 1 capsule (100 mg total) by mouth 2 (two) times daily. 08/17/14  Yes Darlyne Russian, MD  ferrous sulfate 325 (65 FE) MG tablet Take 1 tablet (325 mg total) by mouth 2 (two) times daily. 07/07/14  Yes Darlyne Russian, MD  furosemide (LASIX) 40 MG tablet Take two 40 mg tablets by mouth in the AM;  Take one 40mg  tablet by m outh in the PM 07/07/14  Yes Darlyne Russian, MD  glucose blood (ONE TOUCH ULTRA TEST) test strip Test 1-2 times daily as directed 08/02/14  Yes Chelle Janalee Dane, PA-C  HYDROcodone-acetaminophen (NORCO/VICODIN) 5-325 MG per tablet Take one tablet at night as needed for pain 07/24/14  Yes Darlyne Russian, MD  lisinopril (PRINIVIL,ZESTRIL) 20 MG tablet Take 1 tablet (20 mg total) by mouth daily. Patient taking differently: Take 40 mg by mouth daily with breakfast.  07/31/14  Yes Thayer Headings, MD  Melatonin 5 MG CAPS Take 5 mg by mouth at bedtime.    Yes Historical Provider, MD  metFORMIN (GLUCOPHAGE-XR) 500 MG 24 hr tablet Take 2 pills (1000mg ) twice daily Patient taking differently: Take 1,000 mg by mouth 2 (two) times daily.  07/09/14  Yes Darlyne Russian, MD  metoprolol Tonia [place])  100 MG tablet Take 1 tablet (100 mg total) by mouth 2 (two) times daily. 07/09/14  Yes Darlyne Russian, MD  naproxen sodium (ANAPROX) 220 MG tablet Take 440 mg by mouth 2 (two) times daily.    Yes Historical Provider, MD  omeprazole (PRILOSEC) 20 MG capsule Take 1 capsule (20 mg total) by mouth daily. 07/07/14  Yes Darlyne Russian, MD  potassium chloride SA (K-DUR,KLOR-CON) 20 MEQ tablet Take 1 tablet (20 mEq total) by mouth 2 (two) times daily. 07/07/14  Yes Darlyne Russian, MD  rosuvastatin (CRESTOR) 20 MG tablet Take 1 tablet (20 mg total) by mouth daily. 07/07/14  Yes Darlyne Russian, MD  silver sulfADIAZINE (SILVADENE) 1 % cream Apply 1  application topically 2 (two) times daily. 07/07/14  Yes Darlyne Russian, MD  sitaGLIPtin (JANUVIA) 100 MG tablet Take 1 tablet (100 mg total) by mouth daily. 07/09/14  Yes Darlyne Russian, MD  triamcinolone cream (KENALOG) 0.1 % Apply topically 2 (two) times daily. Patient taking differently: Apply 1 application topically 2 (two) times daily as needed (for arthritis and itching).  07/07/14  Yes Darlyne Russian, MD  triamterene-hydrochlorothiazide (MAXZIDE-25) 37.5-25 MG per tablet Take 1 tablet by mouth daily. 07/09/14  Yes Darlyne Russian, MD  artificial tears (LACRILUBE) OINT ophthalmic ointment Place into the right eye every 4 (four) hours as needed for dry eyes. Patient not taking: Reported on 08/20/2014 08/17/14   Darlyne Russian, MD   BP 132/63 mmHg  Pulse 84  Temp(Src) 98.5 F (36.9 C) (Oral)  Resp 16  SpO2 98% Physical Exam  Constitutional: He is oriented to person, place, and time. He appears well-developed and well-nourished.  HENT:  Head: Normocephalic and atraumatic.  Eyes: Conjunctivae and EOM are normal.  Neck: Normal range of motion. Neck supple.  Cardiovascular: Normal rate, regular rhythm and normal heart sounds.   Pulmonary/Chest: Effort normal and breath sounds normal. No respiratory distress.  Abdominal: He exhibits no distension. There is no tenderness. There is no rebound and no guarding.  Musculoskeletal: Normal range of motion.  Neurological: He is alert and oriented to person, place, and time.  Skin: Skin is warm and dry.  Multiple ulcerations of bil soles: worst over L 1st MTP, also with L 5th MTP and R 1st MTP.  No exposed bone. Purpura of bil le to proximal shins, warmth L>R  Vitals reviewed.   ED Course  Procedures (including critical care time) Labs Review Labs Reviewed  CBC WITH DIFFERENTIAL/PLATELET - Abnormal; Notable for the following:    WBC 19.1 (*)    Hemoglobin 11.9 (*)    MCV 76.9 (*)    MCH 22.3 (*)    MCHC 29.0 (*)    RDW 20.0 (*)    Platelets 630  (*)    Neutrophils Relative % 91 (*)    Neutro Abs 17.4 (*)    Lymphocytes Relative 3 (*)    Lymphs Abs 0.6 (*)    All other components within normal limits  COMPREHENSIVE METABOLIC PANEL - Abnormal; Notable for the following:    Glucose, Bld 149 (*)    GFR calc non Af Amer 90 (*)    All other components within normal limits  CBG MONITORING, ED - Abnormal; Notable for the following:    Glucose-Capillary 156 (*)    All other components within normal limits  I-STAT CG4 LACTIC ACID, ED - Abnormal; Notable for the following:    Lactic Acid, Venous 3.25 (*)  All other components within normal limits  CULTURE, BLOOD (ROUTINE X 2)  CULTURE, BLOOD (ROUTINE X 2)  URINALYSIS, ROUTINE W REFLEX MICROSCOPIC  SEDIMENTATION RATE  C-REACTIVE PROTEIN    Imaging Review Dg Foot Complete Left  08/20/2014   CLINICAL DATA:  Plantar surface soft tissue wounds. Diabetes mellitus.  EXAM: LEFT FOOT - COMPLETE 3+ VIEW  COMPARISON:  August 17, 2014  FINDINGS: Frontal, oblique, and lateral views were obtained. There is marked soft tissue swelling dorsally. There is a soft tissue ulceration posterior to the first MTP joint. There is no erosive change. There is no overt bony destruction. However, there is an area of focal osteopenia in the medial aspect of the proximal portion of the first proximal phalanx. There is focal cortical disruption along the medial mid portion of the first proximal phalanx, likely due to an avulsion type injury. No other evidence of fracture. No dislocation. There is mild narrowing of the first MTP joint with soft tissue swelling in this area.  IMPRESSION: Evidence of soft tissue ulceration volar to the first MTP joint. There is soft tissue swelling in the region of the first MTP joint. There is a subtle area of osteopenia in the medial proximal aspect of the first proximal phalanx. This finding could indicate earliest changes of osteomyelitis. There is no overt bony destruction, however.   There is subtle cortical disruption along the medial aspect of the mid portion of the first proximal phalanx, likely due to avulsion type injury.  There is marked soft tissue swelling dorsally.  No acute fracture or dislocation. There are foci of arterial vascular calcification.  With respect to potential osteomyelitis, MR would be the imaging study of choice to further evaluate. If there is a contraindication to MR, nuclear medicine 3 phase bone scan could be an alternative imaging study to further assess.   Electronically Signed   By: Lowella Grip III M.D.   On: 08/20/2014 15:10   Dg Foot Complete Right  08/20/2014   CLINICAL DATA:  Diabetic wounds to foot.  EXAM: RIGHT FOOT COMPLETE - 3+ VIEW  COMPARISON:  August 17, 2014.  FINDINGS: There is no evidence of fracture or dislocation. Vascular calcifications are noted. No lytic destruction is seen to suggest osteomyelitis. Dorsal soft tissue swelling is noted without radiopaque foreign body.  IMPRESSION: Dorsal soft tissue swelling is noted suggesting cellulitis. No definite evidence of osteomyelitis is noted.   Electronically Signed   By: Marijo Conception, M.D.   On: 08/20/2014 15:12     EKG Interpretation None      MDM   Final diagnoses:  Foot pain  Foot pain, bilateral    74 y.o. male with pertinent PMH of DM presents from PCP with need for wound debridement.  Pt not systemically ill.  He has had cultures of his feet bil with multiple organisms.  On arrival today vitals and physical exam as above.    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  Spoke with orthopedics who will evaluate pt, but recommended medical admission if pt did not have exposed periosteum.  Consulted medicine for admission.  I have reviewed all laboratory and imaging studies if ordered as above  1. Foot pain   2. Foot pain, bilateral         Debby Freiberg, MD 08/20/14 [phone]

## 2014-08-20 NOTE — H&P (Signed)
History and Physical    [name] AVW:[id number] DOB: 21-Jul-1940 DOA: 08/20/2014  Referring physician: Dr. Colin Rhein, ED PCP: Jenny Reichmann, MD  Specialists: orthopedic surgery, Dr. Doran Durand  Chief Complaint: foot infection  HPI: [name] is a 74 y.o. male has a past medical history significant for DM with neuropathy, HTN, HLD, CAD presents to the ED from Dr. Perfecto Kingdom office with worsening bilateral LE foot ulcers. Patient states that they have been developing over the past 2-3 weeks and have progressively gotten worse. He was started on doxycycline a few days ago, however patient has been having difficulties in caring for his ulcers. When he was seen by his PCP today, he was noted to have foul-smelling purulent discharge from his wounds, last use was directed to come to the emergency room for further evaluation. Patient denies any fever or chills, he denies any chest pain or breathing difficulties. He denies any lightheadedness or dizziness. He denies any GI or GU symptoms. In the ED, he was found to have elevated white count to 19 as well as an elevated lactic acid of 3.2. Orthopedic surgery was consulted and recommended admission to [facility] in case he needs surgical debridement. [facility] asked for admission due to ongoing medical problems  Review of Systems: as per history of present illness, otherwise 10 point review of system negative    Past Medical History  Diagnosis Date  . Allergy   . Arthritis   . Diabetes mellitus without complication   . Hypertension   . CHF (congestive heart failure)   . GERD (gastroesophageal reflux disease)   . Hyperlipidemia    Past Surgical History  Procedure Laterality Date  . Joint replacement    . Spine surgery    . Heart stent  2002   Social History:  reports that he quit smoking about 12 years ago. His smoking use included Cigarettes. He has a 30 pack-year smoking history. He has never used smokeless tobacco. He reports that he drinks  alcohol. He reports that he does not use illicit drugs.  Allergies  Allergen Reactions  . Penicillins Shortness Of Breath    Family History  Problem Relation Age of Onset  . Cancer Mother     breast    Prior to Admission medications   Medication Sig Start Date End Date Taking? Authorizing Provider  albuterol (PROVENTIL HFA;VENTOLIN HFA) 108 (90 BASE) MCG/ACT inhaler Inhale 2 puffs into the lungs every 4 (four) hours as needed for wheezing or shortness of breath (cough, shortness of breath or wheezing.). 07/07/14  Yes Darlyne Russian, MD  Coenzyme Q10 (CO Q 10) 100 MG CAPS Take 400 mg by mouth daily.   Yes Historical Provider, MD  doxycycline (VIBRAMYCIN) 100 MG capsule Take 1 capsule (100 mg total) by mouth 2 (two) times daily. 08/17/14  Yes Darlyne Russian, MD  ferrous sulfate 325 (65 FE) MG tablet Take 1 tablet (325 mg total) by mouth 2 (two) times daily. 07/07/14  Yes Darlyne Russian, MD  furosemide (LASIX) 40 MG tablet Take two 40 mg tablets by mouth in the AM;  Take one 40mg  tablet by m outh in the PM 07/07/14  Yes Darlyne Russian, MD  glucose blood (ONE TOUCH ULTRA TEST) test strip Test 1-2 times daily as directed 08/02/14  Yes Chelle Janalee Dane, PA-C  HYDROcodone-acetaminophen (NORCO/VICODIN) 5-325 MG per tablet Take one tablet at night as needed for pain 07/24/14  Yes Darlyne Russian, MD  lisinopril (PRINIVIL,ZESTRIL)  20 MG tablet Take 1 tablet (20 mg total) by mouth daily. Patient taking differently: Take 40 mg by mouth daily with breakfast.  07/31/14  Yes Thayer Headings, MD  Melatonin 5 MG CAPS Take 5 mg by mouth at bedtime.    Yes Historical Provider, MD  metFORMIN (GLUCOPHAGE-XR) 500 MG 24 hr tablet Take 2 pills (1000mg ) twice daily Patient taking differently: Take 1,000 mg by mouth 2 (two) times daily.  07/09/14  Yes Darlyne Russian, MD  metoprolol (LOPRESSOR) 100 MG tablet Take 1 tablet (100 mg total) by mouth 2 (two) times daily. 07/09/14  Yes Darlyne Russian, MD  naproxen sodium (ANAPROX) 220 MG  tablet Take 440 mg by mouth 2 (two) times daily.    Yes Historical Provider, MD  omeprazole (PRILOSEC) 20 MG capsule Take 1 capsule (20 mg total) by mouth daily. 07/07/14  Yes Darlyne Russian, MD  potassium chloride SA (K-DUR,KLOR-CON) 20 MEQ tablet Take 1 tablet (20 mEq total) by mouth 2 (two) times daily. 07/07/14  Yes Darlyne Russian, MD  rosuvastatin (CRESTOR) 20 MG tablet Take 1 tablet (20 mg total) by mouth daily. 07/07/14  Yes Darlyne Russian, MD  silver sulfADIAZINE (SILVADENE) 1 % cream Apply 1 application topically 2 (two) times daily. 07/07/14  Yes Darlyne Russian, MD  sitaGLIPtin (JANUVIA) 100 MG tablet Take 1 tablet (100 mg total) by mouth daily. 07/09/14  Yes Darlyne Russian, MD  triamcinolone cream (KENALOG) 0.1 % Apply topically 2 (two) times daily. Patient taking differently: Apply 1 application topically 2 (two) times daily as needed (for arthritis and itching).  07/07/14  Yes Darlyne Russian, MD  triamterene-hydrochlorothiazide (MAXZIDE-25) 37.5-25 MG per tablet Take 1 tablet by mouth daily. 07/09/14  Yes Darlyne Russian, MD  artificial tears (LACRILUBE) OINT ophthalmic ointment Place into the right eye every 4 (four) hours as needed for dry eyes. Patient not taking: Reported on 08/20/2014 08/17/14   Darlyne Russian, MD   Physical Exam: Filed Vitals:   08/20/14 1233 08/20/14 1632  BP: 132/63 150/70  Pulse: 84 85  Temp: 98.5 F (36.9 C)   TempSrc: Oral   Resp: 16 16  SpO2: 98% 94%    General:  No apparent distress  Eyes: PERRL, EOMI, no scleral icterus  ENT: moist oropharynx  Neck: supple, no lymphadenopathy  Cardiovascular: regular rate without MRG; 2+ peripheral pulses, no JVD, no peripheral edema  Respiratory: CTA biL, good air movement without wheezing, rhonchi or crackled  Abdomen: soft, non tender to palpation, positive bowel sounds, no guarding, no rebound  Skin: no rashes  Musculoskeletal: normal bulk and tone, no joint swelling, chronic venous stasis changes bilaterally, 3  ulcers about 1.5-2 cm, one on right and 2 on left foot, with mild surrounding cellulitis   Psychiatric: normal mood and affect  Neurologic: non focal   Labs on Admission:  Basic Metabolic Panel:  Recent Labs Lab 08/17/14 1005 08/20/14 1300  NA 136 137  K 4.1 4.0  CL 95* 96  CO2 32 28  GLUCOSE 122* 149*  BUN 16 15  CREATININE 0.75 0.76  CALCIUM 9.1 9.3   Liver Function Tests:  Recent Labs Lab 08/20/14 1300  AST 25  ALT 9  ALKPHOS 88  BILITOT 1.0  PROT 7.1  ALBUMIN 3.9   CBC:  Recent Labs Lab 08/17/14 1018 08/20/14 1300  WBC 19.4* 19.1*  NEUTROABS  --  17.4*  HGB 11.9* 11.9*  HCT 38.4* 41.0  MCV 71.2* 76.9*  PLT  --  630*   CBG:  Recent Labs Lab 08/20/14 1256  GLUCAP 156*    Radiological Exams on Admission: Dg Foot Complete Left  08/20/2014   CLINICAL DATA:  Plantar surface soft tissue wounds. Diabetes mellitus.  EXAM: LEFT FOOT - COMPLETE 3+ VIEW  COMPARISON:  August 17, 2014  FINDINGS: Frontal, oblique, and lateral views were obtained. There is marked soft tissue swelling dorsally. There is a soft tissue ulceration posterior to the first MTP joint. There is no erosive change. There is no overt bony destruction. However, there is an area of focal osteopenia in the medial aspect of the proximal portion of the first proximal phalanx. There is focal cortical disruption along the medial mid portion of the first proximal phalanx, likely due to an avulsion type injury. No other evidence of fracture. No dislocation. There is mild narrowing of the first MTP joint with soft tissue swelling in this area.  IMPRESSION: Evidence of soft tissue ulceration volar to the first MTP joint. There is soft tissue swelling in the region of the first MTP joint. There is a subtle area of osteopenia in the medial proximal aspect of the first proximal phalanx. This finding could indicate earliest changes of osteomyelitis. There is no overt bony destruction, however.  There is subtle  cortical disruption along the medial aspect of the mid portion of the first proximal phalanx, likely due to avulsion type injury.  There is marked soft tissue swelling dorsally.  No acute fracture or dislocation. There are foci of arterial vascular calcification.  With respect to potential osteomyelitis, MR would be the imaging study of choice to further evaluate. If there is a contraindication to MR, nuclear medicine 3 phase bone scan could be an alternative imaging study to further assess.   Electronically Signed   By: Lowella Grip III M.D.   On: 08/20/2014 15:10   Dg Foot Complete Right  08/20/2014   CLINICAL DATA:  Diabetic wounds to foot.  EXAM: RIGHT FOOT COMPLETE - 3+ VIEW  COMPARISON:  August 17, 2014.  FINDINGS: There is no evidence of fracture or dislocation. Vascular calcifications are noted. No lytic destruction is seen to suggest osteomyelitis. Dorsal soft tissue swelling is noted without radiopaque foreign body.  IMPRESSION: Dorsal soft tissue swelling is noted suggesting cellulitis. No definite evidence of osteomyelitis is noted.   Electronically Signed   By: Marijo Conception, M.D.   On: 08/20/2014 15:12    Assessment/Plan Principal Problem:   Diabetic foot infection Active Problems:   CAD (coronary artery disease)   Hypertension   Hyperlipidemia   Neuropathy   Leucocytosis   Diabetic foot ulcer - x-ray of the left foot showed possible osteomyelitis - Orthopedic surgeon consulted, appreciate input, will evaluate patient later today - Obtain MRI of the left foot for definitive evaluation of osteomyelitis - Obtain sedimentation rate - Given leukocytosis and elevated lactic acid, we'll start vancomycin as he seems not to respond to doxycycline  Coronary artery disease - stable, no chest pain, was seen by cardiology as an outpatient, continue home medications  Hypertension - continue home medications  Hyperlipidemia - continue home medications  Diabetes mellitus - resume  metformin, Januvia, and sliding scale insulin   Diet: carb modified Fluids: NS DVT Prophylaxis: Lovenox  Code Status: Full  Family Communication: d/w son bedside  Disposition Plan: admit to [place]. Cruzita Lederer, MD [facility] Pager [phone]  If 7PM-7AM, please contact night-coverage [email or web address] Password [facility] 08/20/2014, 5:03 PM

## 2014-08-20 NOTE — Progress Notes (Signed)
ANTIBIOTIC CONSULT NOTE - INITIAL  Pharmacy Consult for Vancomycin Indication: Cellulitis/Possible Osteomyelitis  Allergies  Allergen Reactions  . Penicillins Shortness Of Breath    Patient Measurements: Height: 69 inches Weight: 104.3 kg  Vital Signs: Temp: 98.5 F (36.9 C) (02/23 1233) Temp Source: Oral (02/23 1233) BP: 150/70 mmHg (02/23 1632) Pulse Rate: 85 (02/23 1632) Intake/Output from previous day:   Intake/Output from this shift:    Labs:  Recent Labs  08/20/14 1300  WBC 19.1*  HGB 11.9*  PLT 630*  CREATININE 0.76   Estimated Creatinine Clearance: 100.7 mL/min (by C-G formula based on Cr of 0.76). No results for input(s): VANCOTROUGH, VANCOPEAK, VANCORANDOM, GENTTROUGH, GENTPEAK, GENTRANDOM, TOBRATROUGH, TOBRAPEAK, TOBRARND, AMIKACINPEAK, AMIKACINTROU, AMIKACIN in the last 72 hours.   Microbiology: Recent Results (from the past 720 hour(s))  Wound culture     Status: None   Collection Time: 08/17/14 10:05 AM  Result Value Ref Range Status   Gram Stain No WBC Seen  Final   Gram Stain No Squamous Epithelial Cells Seen  Final   Gram Stain Moderate Gram Positive Cocci In Pairs In Clusters  Final   Gram Stain Few Gram Negative Rods  Final   Organism ID, Bacteria Multiple Organisms Present,None Predominant  Final    Comment: No Staphylococcus aureus isolated NO GROUP A STREP (S. PYOGENES) ISOLATED     Medical History: Past Medical History  Diagnosis Date  . Allergy   . Arthritis   . Diabetes mellitus without complication   . Hypertension   . CHF (congestive heart failure)   . GERD (gastroesophageal reflux disease)   . Hyperlipidemia     Medications:  Scheduled:  . enoxaparin (LOVENOX) injection  50 mg Subcutaneous Q24H  . [START ON 08/21/2014] insulin aspart  0-9 Units Subcutaneous TID WC   Infusions:  . sodium chloride    . vancomycin     Assessment:  74 yr male with significant PMH including DM with neuropathy, HTN, CAD with worsening  bilateral foot ulcers.  Has been on doxycycline PTA.  CrCl (n) = 90.7 ml/min; (CG) = 100.7 ml/min  Blood cultures x 2 ordered  Xray of left foot shows possible osteomyelitis; plan for MRI to confirm  Pharmacy consulted to dose Vancomycin   Goal of Therapy:  Vancomycin trough level 15-20 mcg/ml  If MRI shows no osteomyelitis, can change goal to 10-15 mcg/ml for just cellulitis  Plan:  Measure antibiotic drug levels at steady state Follow up culture results  Vancomycin 2000mg  IV x 1 then 1000mg  IV q12h  Jushua Waltman, Toribio Harbour, PharmD 08/20/2014,5:35 PM

## 2014-08-20 NOTE — Progress Notes (Signed)
Subjective:  This chart was scribed for [name], MD by Donato Schultz, Medical Scribe. This patient was seen in Room 25 and the patient's care was started at 11:34 AM.   Patient ID: [name], male    DOB: 1940/09/16, 74 y.o.   MRN: [id number]  HPI HPI Comments: [name] is a 74 y.o. 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  He has been treating these areas with Doxycycline and application of Silvadene ointment.  He has not been able to reach his feet to do a good, thorough cleaning.  The ulcerations, especially on the right foot, have had a foul odor.  He denies fever, chills, vomiting, or systemic illness as associated symptoms.  Of note, he has a peripheral neuropathy of both feet.  He is a diabetic.  He has a significantly elevated white count and has an appointment to be seen by the hematologist in March.  He has an appointment to be seen at the [facility] in [facility] on February 25.     Past Medical History  Diagnosis Date  . Allergy   . Arthritis   . Diabetes mellitus without complication   . Hypertension   . CHF (congestive heart failure)   . GERD (gastroesophageal reflux disease)   . Hyperlipidemia    Past Surgical History  Procedure Laterality Date  . Joint replacement    . Spine surgery    . Heart stent  2002   Family History  Problem Relation Age of Onset  . Cancer Mother     breast   History   Social History  . Marital Status: Married    Spouse Name: [name]  . Number of Children: [name]  . Years of Education: [name]   Occupational History  . Not on file.   Social History Main Topics  . Smoking status: Former Smoker -- 2.00 packs/day for 15 years    Types: Cigarettes    Quit date: 08/31/2001  . Smokeless tobacco: Never Used  . Alcohol Use: Yes     Comment: occ  . Drug Use: No  . Sexual Activity: Not on file   Other Topics Concern  . Not on file   Social History  Narrative   Allergies  Allergen Reactions  . Penicillins Shortness Of Breath    Review of Systems  Cardiovascular: Positive for leg swelling.  Musculoskeletal: Positive for gait problem.  Skin: Positive for color change and wound.    Objective:  Physical Exam  Constitutional: He is oriented to person, place, and time. He appears well-developed and well-nourished.  HENT:  Head: Normocephalic and atraumatic.  Eyes: EOM are normal.  Neck: Normal range of motion.  Cardiovascular: Normal rate.   Circulation to the toes appears normal.    Pulmonary/Chest: Effort normal.  Musculoskeletal: Normal range of motion. He exhibits edema.  He has edema and redness of both lower legs which extends to the feet bilaterally.  His swelling as significantly decreased over the last 3-4 days.   Neurological: He is alert and oriented to person, place, and time.  Skin: Skin is warm and dry.  There is scaling of the skin.  There is a 3x3cm purulent abscess over the ball of the foot distal right first metatarsal.  Examination of the left foot reveals a 3x3cm neuropathic ulcer distal first metatarsal with deep crater and a smaller 1x2cm neuropathic ulcer over the distal left fifth  metatarsal.    Psychiatric: He has a normal mood and affect. His behavior is normal.  Nursing note and vitals reviewed.    BP 146/73 mmHg  Pulse 86  Temp(Src) 97.9 F (36.6 C)  Resp 16  Ht 5\' 9"  (1.753 m)  Wt 230 lb (104.327 kg)  BMI 33.95 kg/m2  SpO2 97% Assessment & Plan:  Patient has bilateral neuropathic ulcers.  The lesion on the right foot has significant purulence and needs debridement.  The two ulcers on the left foot also need debridement.  Recent culture has multiple organisms.  He has been on Silvadene topical and Doxycycline since Sunday.  Patient referred to [facility] for evaluation for evaluation for admission and consideration for debridement of these areas.I personally performed the services described in  this documentation, which was scribed in my presence. The recorded information has been reviewed and is accurate.

## 2014-08-20 NOTE — Progress Notes (Signed)
  CARE MANAGEMENT ED NOTE 08/20/2014  Patient:  [name], [name]   Account Number:  [id number]  Date Initiated:  08/20/2014  Documentation initiated by:  Livia Snellen  Subjective/Objective Assessment:   Patient presents to Ed with ulcers to bilateral feet     Subjective/Objective Assessment Detail:   Patient with pmhx of diabetes, HTN, CHF, GERD, hyperlipidemia     Action/Plan:   Patient to tranfer to [facility]   Action/Plan Detail:   Anticipated DC Date:       Status Recommendation to Physician:   Result of Recommendation:    Other ED [place]  Other  PCP issues    Choice offered to / List presented to:  C-1 Patient          Status of service:  Completed, signed off  ED Comments:   ED Comments Detail:  EDCM spoke to patient at bedside.  Patient confirms his pcp is Dr. Everlene Farrier with [employer] [facility].  Patient lives at home with his wife.  Patient reports he has a walker and cane at home but doesn't use them.  Patient reports he has had home health services in the past, but cannot remember the agency name.  Patient reports he is able to complete his ADL's on his own.  EDCM provided patient with list of home health agencies in [facility], explained services.  No further EDCM needs at this time.

## 2014-08-20 NOTE — ED Notes (Signed)
Admission doctor at bedside

## 2014-08-20 NOTE — Telephone Encounter (Signed)
There is a dragon misquoted on the last message I received a phone call from Luellen Pucker who is Mr. Hogan's wife

## 2014-08-21 ENCOUNTER — Inpatient Hospital Stay (HOSPITAL_COMMUNITY): Payer: Medicare HMO

## 2014-08-21 LAB — GLUCOSE, CAPILLARY
GLUCOSE-CAPILLARY: 147 mg/dL — AB (ref 70–99)
Glucose-Capillary: 155 mg/dL — ABNORMAL HIGH (ref 70–99)
Glucose-Capillary: 166 mg/dL — ABNORMAL HIGH (ref 70–99)
Glucose-Capillary: 151 mg/dL — ABNORMAL HIGH (ref 70–99)

## 2014-08-21 MED ORDER — TRIAMCINOLONE ACETONIDE 0.1 % EX CREA
TOPICAL_CREAM | Freq: Two times a day (BID) | CUTANEOUS | Status: DC | PRN
  Administered 2014-08-21: 1 via TOPICAL
  Administered 2014-08-21: 07:00:00 via TOPICAL
  Filled 2014-08-21 (×2): qty 15

## 2014-08-21 MED ORDER — LISINOPRIL 40 MG PO TABS
40.0000 mg | ORAL_TABLET | Freq: Every day | ORAL | Status: DC
  Administered 2014-08-22 – 2014-08-24 (×3): 40 mg via ORAL
  Filled 2014-08-21 (×2): qty 4
  Filled 2014-08-21 (×2): qty 1

## 2014-08-21 MED ORDER — GADOBENATE DIMEGLUMINE 529 MG/ML IV SOLN
20.0000 mL | Freq: Once | INTRAVENOUS | Status: AC | PRN
  Administered 2014-08-21: 20 mL via INTRAVENOUS

## 2014-08-21 MED ORDER — CIPROFLOXACIN IN D5W 400 MG/200ML IV SOLN
400.0000 mg | Freq: Two times a day (BID) | INTRAVENOUS | Status: DC
  Administered 2014-08-21 – 2014-08-23 (×4): 400 mg via INTRAVENOUS
  Filled 2014-08-21 (×5): qty 200

## 2014-08-21 MED ORDER — HYDRALAZINE HCL 20 MG/ML IJ SOLN: 10.0000 mg | Freq: Three times a day (TID) | INTRAMUSCULAR | Status: DC | PRN

## 2014-08-21 MED ORDER — ALBUTEROL SULFATE (2.5 MG/3ML) 0.083% IN NEBU
2.5000 mg | INHALATION_SOLUTION | RESPIRATORY_TRACT | Status: DC | PRN
  Administered 2014-08-21 – 2014-08-23 (×4): 2.5 mg via RESPIRATORY_TRACT
  Filled 2014-08-21 (×3): qty 3

## 2014-08-21 NOTE — Consult Note (Signed)
Reason for Consult:  bilat forefoot ulcers Referring Physician: Dr. Jim Desanctis is an 74 y.o. male.  HPI:  74 y/o male with PMH of diabetes and peripheral neuropathy was admitted yesterday via the ED after visiting Dr. Perfecto Kingdom office.  The pt c/o ulcers that formed on the forefoot bilaterally over the last few weeks.  He denies any injury to either foot.  He has no h/o ulcers or amputations.  He denies any pain at the forefoot.  He has not seen a podiatrist or orthopaedist for care of his callouses.  He denies any f/c/n/v/wt loss.  No drainage from the ulcers.  He does not wear diabetic shoes or custom orthotics.  No h/o surgery or either foot.  Past Medical History  Diagnosis Date  . Allergy   . Arthritis   . Diabetes mellitus without complication   . Hypertension   . CHF (congestive heart failure)   . GERD (gastroesophageal reflux disease)   . Hyperlipidemia     Past Surgical History  Procedure Laterality Date  . Joint replacement    . Spine surgery    . Heart stent  2002    Family History  Problem Relation Age of Onset  . Cancer Mother     breast    Social History:  reports that he quit smoking about 12 years ago. His smoking use included Cigarettes. He has a 30 pack-year smoking history. He has never used smokeless tobacco. He reports that he drinks alcohol. He reports that he does not use illicit drugs.  Allergies:  Allergies  Allergen Reactions  . Penicillins Shortness Of Breath    Medications: I have reviewed the patient's current medications.  Results for orders placed or performed during the hospital encounter of 08/20/14 (from the past 48 hour(s))  CBG monitoring, ED     Status: Abnormal   Collection Time: 08/20/14 12:56 PM  Result Value Ref Range   Glucose-Capillary 156 (H) 70 - 99 mg/dL  CBC WITH DIFFERENTIAL     Status: Abnormal   Collection Time: 08/20/14  1:00 PM  Result Value Ref Range   WBC 19.1 (H) 4.0 - 10.5 K/uL   RBC 5.33 4.22 - 5.81  MIL/uL   Hemoglobin 11.9 (L) 13.0 - 17.0 g/dL   HCT 41.0 39.0 - 52.0 %   MCV 76.9 (L) 78.0 - 100.0 fL   MCH 22.3 (L) 26.0 - 34.0 pg   MCHC 29.0 (L) 30.0 - 36.0 g/dL   RDW 20.0 (H) 11.5 - 15.5 %   Platelets 630 (H) 150 - 400 K/uL   Neutrophils Relative % 91 (H) 43 - 77 %   Neutro Abs 17.4 (H) 1.7 - 7.7 K/uL   Lymphocytes Relative 3 (L) 12 - 46 %   Lymphs Abs 0.6 (L) 0.7 - 4.0 K/uL   Monocytes Relative 4 3 - 12 %   Monocytes Absolute 0.7 0.1 - 1.0 K/uL   Eosinophils Relative 2 0 - 5 %   Eosinophils Absolute 0.3 0.0 - 0.7 K/uL   Basophils Relative 0 0 - 1 %   Basophils Absolute 0.1 0.0 - 0.1 K/uL  Comprehensive metabolic panel     Status: Abnormal   Collection Time: 08/20/14  1:00 PM  Result Value Ref Range   Sodium 137 135 - 145 mmol/L   Potassium 4.0 3.5 - 5.1 mmol/L   Chloride 96 96 - 112 mmol/L   CO2 28 19 - 32 mmol/L   Glucose, Bld 149 (H) 70 -  99 mg/dL   BUN 15 6 - 23 mg/dL   Creatinine, Ser 0.76 0.50 - 1.35 mg/dL   Calcium 9.3 8.4 - 10.5 mg/dL   Total Protein 7.1 6.0 - 8.3 g/dL   Albumin 3.9 3.5 - 5.2 g/dL   AST 25 0 - 37 U/L   ALT 9 0 - 53 U/L   Alkaline Phosphatase 88 39 - 117 U/L   Total Bilirubin 1.0 0.3 - 1.2 mg/dL   GFR calc non Af Amer 90 (L) >90 mL/min   GFR calc Af Amer >90 >90 mL/min    Comment: (NOTE) The eGFR has been calculated using the CKD EPI equation. This calculation has not been validated in all clinical situations. eGFR's persistently <90 mL/min signify possible Chronic Kidney Disease.    Anion gap 13 5 - 15  I-Stat CG4 Lactic Acid, ED     Status: Abnormal   Collection Time: 08/20/14  1:12 PM  Result Value Ref Range   Lactic Acid, Venous 3.25 (HH) 0.5 - 2.0 mmol/L   Comment NOTIFIED PHYSICIAN   Urinalysis with microscopic     Status: None   Collection Time: 08/20/14  1:42 PM  Result Value Ref Range   Color, Urine YELLOW YELLOW   APPearance CLEAR CLEAR   Specific Gravity, Urine 1.012 1.005 - 1.030   pH 6.5 5.0 - 8.0   Glucose, UA NEGATIVE  NEGATIVE mg/dL   Hgb urine dipstick NEGATIVE NEGATIVE   Bilirubin Urine NEGATIVE NEGATIVE   Ketones, ur NEGATIVE NEGATIVE mg/dL   Protein, ur NEGATIVE NEGATIVE mg/dL   Urobilinogen, UA 1.0 0.0 - 1.0 mg/dL   Nitrite NEGATIVE NEGATIVE   Leukocytes, UA NEGATIVE NEGATIVE    Comment: MICROSCOPIC NOT DONE ON URINES WITH NEGATIVE PROTEIN, BLOOD, LEUKOCYTES, NITRITE, OR GLUCOSE <1000 mg/dL.  Blood culture (routine x 2)     Status: None (Preliminary result)   Collection Time: 08/20/14  2:04 PM  Result Value Ref Range   Specimen Description BLOOD RIGHT ARM    Special Requests      BOTTLES DRAWN AEROBIC AND ANAEROBIC 5CC BOTH BOTTLES   Culture             BLOOD CULTURE RECEIVED NO GROWTH TO DATE CULTURE WILL BE HELD FOR 5 DAYS BEFORE ISSUING A FINAL NEGATIVE REPORT Performed at [facility]    Report Status PENDING   Blood culture (routine x 2)     Status: None (Preliminary result)   Collection Time: 08/20/14  2:04 PM  Result Value Ref Range   Specimen Description BLOOD LEFT ARM    Special Requests      BOTTLES DRAWN AEROBIC AND ANAEROBIC 5CC BOTH BOTTLES   Culture             BLOOD CULTURE RECEIVED NO GROWTH TO DATE CULTURE WILL BE HELD FOR 5 DAYS BEFORE ISSUING A FINAL NEGATIVE REPORT Performed at [facility]    Report Status PENDING   Sedimentation rate     Status: None   Collection Time: 08/20/14  2:22 PM  Result Value Ref Range   Sed Rate 13 0 - 16 mm/hr  C-reactive protein     Status: Abnormal   Collection Time: 08/20/14  2:22 PM  Result Value Ref Range   CRP 0.5 (L) <0.60 mg/dL    Comment: Performed at [facility]  CBG monitoring, ED     Status: Abnormal   Collection Time: 08/20/14  7:51 PM  Result Value Ref Range   Glucose-Capillary  156 (H) 70 - 99 mg/dL  Glucose, capillary     Status: Abnormal   Collection Time: 08/20/14  9:11 PM  Result Value Ref Range   Glucose-Capillary 181 (H) 70 - 99 mg/dL   Comment 1 Notify RN   Glucose, capillary      Status: Abnormal   Collection Time: 08/21/14  8:00 AM  Result Value Ref Range   Glucose-Capillary 155 (H) 70 - 99 mg/dL   Comment 1 Notify RN     Mr Foot Left W Wo Contrast  08/21/2014   CLINICAL DATA:  Bilateral lower extremity foot ulcers. Osteomyelitis.  EXAM: MRI OF THE LEFT FOREFOOT WITHOUT AND WITH CONTRAST  TECHNIQUE: Multiplanar, multisequence MR imaging was performed both before and after administration of intravenous contrast.  CONTRAST:  21m MULTIHANCE GADOBENATE DIMEGLUMINE 529 MG/ML IV SOLN  COMPARISON:  Radiographs 08/20/2014.  FINDINGS: Diffuse forefoot cellulitis is present. No discrete abscess. There is no osteomyelitis. No septic arthritis. Flexor and extensor tendons are within normal limits. Ulceration is present over the medial plantar aspect of the great toe. This does not extend a bone. The great toe sesamoids are within normal limits. The ulcer does extend into the subcutaneous tissues, with undermining the subcutaneous fat (image 8 series 11).  IMPRESSION: 1. Negative for osteomyelitis or abscess. 2. Plantar great toe ulcer extending into the deep subcutaneous tissues. No abscess. 3. Diffuse forefoot cellulitis.   Electronically Signed   By: GDereck LigasM.D.   On: 08/21/2014 08:02   Dg Foot Complete Left  08/20/2014   CLINICAL DATA:  Plantar surface soft tissue wounds. Diabetes mellitus.  EXAM: LEFT FOOT - COMPLETE 3+ VIEW  COMPARISON:  August 17, 2014  FINDINGS: Frontal, oblique, and lateral views were obtained. There is marked soft tissue swelling dorsally. There is a soft tissue ulceration posterior to the first MTP joint. There is no erosive change. There is no overt bony destruction. However, there is an area of focal osteopenia in the medial aspect of the proximal portion of the first proximal phalanx. There is focal cortical disruption along the medial mid portion of the first proximal phalanx, likely due to an avulsion type injury. No other evidence of fracture. No  dislocation. There is mild narrowing of the first MTP joint with soft tissue swelling in this area.  IMPRESSION: Evidence of soft tissue ulceration volar to the first MTP joint. There is soft tissue swelling in the region of the first MTP joint. There is a subtle area of osteopenia in the medial proximal aspect of the first proximal phalanx. This finding could indicate earliest changes of osteomyelitis. There is no overt bony destruction, however.  There is subtle cortical disruption along the medial aspect of the mid portion of the first proximal phalanx, likely due to avulsion type injury.  There is marked soft tissue swelling dorsally.  No acute fracture or dislocation. There are foci of arterial vascular calcification.  With respect to potential osteomyelitis, MR would be the imaging study of choice to further evaluate. If there is a contraindication to MR, nuclear medicine 3 phase bone scan could be an alternative imaging study to further assess.   Electronically Signed   By: WLowella GripIII M.D.   On: 08/20/2014 15:10   Dg Foot Complete Right  08/20/2014   CLINICAL DATA:  Diabetic wounds to foot.  EXAM: RIGHT FOOT COMPLETE - 3+ VIEW  COMPARISON:  August 17, 2014.  FINDINGS: There is no evidence of fracture or dislocation. Vascular calcifications  are noted. No lytic destruction is seen to suggest osteomyelitis. Dorsal soft tissue swelling is noted without radiopaque foreign body.  IMPRESSION: Dorsal soft tissue swelling is noted suggesting cellulitis. No definite evidence of osteomyelitis is noted.   Electronically Signed   By: Marijo Conception, M.D.   On: 08/20/2014 15:12    ROS:  As above PE:  Blood pressure 168/83, pulse 99, temperature 98.1 F (36.7 C), temperature source Oral, resp. rate 18, height 5' 9"  (1.753 m), weight 100.3 kg (221 lb 1.9 oz), SpO2 94 %. wn wd male in nad.  A and O x 4.  Mood and affect normal.  HOH.  EOMI.  resp unlabored.  Bilat feet with thick callouses under the  1st and 5th mT heads.  L foot has ulcers that are nickel sized at the 1st and 5th MT head.  R has a nickel sized ulcer at the 1st MT head but none at the 5th.  Skin ow intact but dry.  No lymphadenopathy.  Sens to LT diminshed at the forefoot.  5/5 strength in PF and DF of the ankles.  Tight heelcords bilat.  None of the ulcers probe to bone.  No purulence noted.  All appear to be healing.  Xrays:  ? Early osteo on L foot  - none on right.  MRI:  No osteo on left foot.  Assessment/Plan: Bilateral diabetic foot ulcers.  Today I debrided the callouses and confirmed no exposed bone.  I dressed with dry dressings.  I believe the lactic acid is likely an erroneous lab value, as his clinical picture is benign with no sign of infection.  ESR and CRP support this conclusion as well.  I believe he can be discharged home with f/u at the wound center for management of these forefoot ulcers.  Ultimately he needs diabetic orthotics and extra depth diabetic shoes.  I'm happy to see him back in clinic after he's healed his ulcers to follow his callouses and coordinate orthotics.  I'll sign off.  Please cll with any questions.  [phone].  Wylene Simmer 08/21/2014, 8:13 AM

## 2014-08-21 NOTE — Progress Notes (Signed)
[facility] PROGRESS NOTE  [name] GLO:[id number] DOB: [birth year] DOA: 08/20/2014 PCP: Jenny Reichmann, MD  Summary I have seen and examined [name] at bedside and reviewed his chart. Appreciate orthopedics. [name] is a pleasant 74 y.o. male has a past medical history significant for DM with neuropathy, HTN, HLD, CAD who presented to the ED from Dr. Perfecto Kingdom office with worsening bilateral LE foot ulcers, and he was found to have diabetic foot infection not responding to doxycycline being given in the outpatient setting. There was concern for osteomyelitis suggested by x-ray but MRI of the right foot did not suggest osteomyelitis, "1. Negative for osteomyelitis or abscess. 2. Plantar great toe ulcer extending into the deep subcutaneous tissues. No abscess. 3. Diffuse forefoot cellulitis". His white count was 19,000 and he had lactic acidosis at the time of admission. He was started on vancomycin and ahead bedside debridement today. Blood cultures remain negative to date. Not sure if cultures sent from the debridement today. Will add Ciprofloxacin for gram-negative coverage and await final culture results prior to discharge, hopefully in the next 1-2 days if patient continues to improve. Plan Diabetic foot infection/Leucocytosis  Follow septic workup  Add Ciprofloxacin  Follow orthopedics recommendations CAD (coronary artery disease)/Hypertension/Hyperlipidemia/Neuropathy  No acute changes  Continue current management Code Status: Full Code. Family Communication: No family at bedside Disposition Plan: Eventually home   Consultants:  Orthopedics  Procedures:  Bedside debridement of diabetic foot  Antibiotics:  Vancomycin 08/20/2014>  Ciprofloxacin 08/21/14>  HPI/Subjective: Feels 100% better  Objective: Filed Vitals:   08/21/14 0634  BP: 168/83  Pulse:   Temp: 98.1 F (36.7 C)  Resp: 18    Intake/Output Summary (Last 24 hours) at 08/21/14  1256 Last data filed at 08/21/14 0854  Gross per 24 hour  Intake    820 ml  Output   1250 ml  Net   -430 ml   Filed Weights   08/20/14 2104  Weight: 100.3 kg (221 lb 1.9 oz)    Exam:   General:  Comfortable at rest.  Cardiovascular: S1-S2 normal. No murmurs. Pulse regular.  Respiratory: Good air entry bilaterally. No rhonchi or rales.  Abdomen: Soft and nontender. Normal bowel sounds. No organomegaly.  Musculoskeletal: No pedal edema. No drainage from the ulcers  Neurological: Intact  Data Reviewed: Basic Metabolic Panel:  Recent Labs Lab 08/17/14 1005 08/20/14 1300  NA 136 137  K 4.1 4.0  CL 95* 96  CO2 32 28  GLUCOSE 122* 149*  BUN 16 15  CREATININE 0.75 0.76  CALCIUM 9.1 9.3   Liver Function Tests:  Recent Labs Lab 08/20/14 1300  AST 25  ALT 9  ALKPHOS 88  BILITOT 1.0  PROT 7.1  ALBUMIN 3.9   No results for input(s): LIPASE, AMYLASE in the last 168 hours. No results for input(s): AMMONIA in the last 168 hours. CBC:  Recent Labs Lab 08/17/14 1018 08/20/14 1300  WBC 19.4* 19.1*  NEUTROABS  --  17.4*  HGB 11.9* 11.9*  HCT 38.4* 41.0  MCV 71.2* 76.9*  PLT  --  630*   Cardiac Enzymes: No results for input(s): CKTOTAL, CKMB, CKMBINDEX, TROPONINI in the last 168 hours. BNP (last 3 results) No results for input(s): BNP in the last 8760 hours.  ProBNP (last 3 results) No results for input(s): PROBNP in the last 8760 hours.  CBG:  Recent Labs Lab 08/20/14 1256 08/20/14 1951 08/20/14 2111 08/21/14 0800 08/21/14 1157  GLUCAP 156* 156* 181* 155*  166*    Recent Results (from the past 240 hour(s))  Wound culture     Status: None   Collection Time: 08/17/14 10:05 AM  Result Value Ref Range Status   Gram Stain No WBC Seen  Final   Gram Stain No Squamous Epithelial Cells Seen  Final   Gram Stain Moderate Gram Positive Cocci In Pairs In Clusters  Final   Gram Stain Few Gram Negative Rods  Final   Organism ID, Bacteria Multiple  Organisms Present,None Predominant  Final    Comment: No Staphylococcus aureus isolated NO GROUP A STREP (S. PYOGENES) ISOLATED   Blood culture (routine x 2)     Status: None (Preliminary result)   Collection Time: 08/20/14  2:04 PM  Result Value Ref Range Status   Specimen Description BLOOD RIGHT ARM  Final   Special Requests   Final    BOTTLES DRAWN AEROBIC AND ANAEROBIC 5CC BOTH BOTTLES   Culture   Final           BLOOD CULTURE RECEIVED NO GROWTH TO DATE CULTURE WILL BE HELD FOR 5 DAYS BEFORE ISSUING A FINAL NEGATIVE REPORT Performed at [facility]    Report Status PENDING  Incomplete  Blood culture (routine x 2)     Status: None (Preliminary result)   Collection Time: 08/20/14  2:04 PM  Result Value Ref Range Status   Specimen Description BLOOD LEFT ARM  Final   Special Requests   Final    BOTTLES DRAWN AEROBIC AND ANAEROBIC 5CC BOTH BOTTLES   Culture   Final           BLOOD CULTURE RECEIVED NO GROWTH TO DATE CULTURE WILL BE HELD FOR 5 DAYS BEFORE ISSUING A FINAL NEGATIVE REPORT Performed at [facility]    Report Status PENDING  Incomplete     Studies: Mr Foot Left W Wo Contrast  08/21/2014   CLINICAL DATA:  Bilateral lower extremity foot ulcers. Osteomyelitis.  EXAM: MRI OF THE LEFT FOREFOOT WITHOUT AND WITH CONTRAST  TECHNIQUE: Multiplanar, multisequence MR imaging was performed both before and after administration of intravenous contrast.  CONTRAST:  50mL MULTIHANCE GADOBENATE DIMEGLUMINE 529 MG/ML IV SOLN  COMPARISON:  Radiographs 08/20/2014.  FINDINGS: Diffuse forefoot cellulitis is present. No discrete abscess. There is no osteomyelitis. No septic arthritis. Flexor and extensor tendons are within normal limits. Ulceration is present over the medial plantar aspect of the great toe. This does not extend a bone. The great toe sesamoids are within normal limits. The ulcer does extend into the subcutaneous tissues, with undermining the subcutaneous fat (image 8  series 11).  IMPRESSION: 1. Negative for osteomyelitis or abscess. 2. Plantar great toe ulcer extending into the deep subcutaneous tissues. No abscess. 3. Diffuse forefoot cellulitis.   Electronically Signed   By: Dereck Ligas M.D.   On: 08/21/2014 08:02   Dg Foot Complete Left  08/20/2014   CLINICAL DATA:  Plantar surface soft tissue wounds. Diabetes mellitus.  EXAM: LEFT FOOT - COMPLETE 3+ VIEW  COMPARISON:  August 17, 2014  FINDINGS: Frontal, oblique, and lateral views were obtained. There is marked soft tissue swelling dorsally. There is a soft tissue ulceration posterior to the first MTP joint. There is no erosive change. There is no overt bony destruction. However, there is an area of focal osteopenia in the medial aspect of the proximal portion of the first proximal phalanx. There is focal cortical disruption along the medial mid portion of the first proximal phalanx, likely  due to an avulsion type injury. No other evidence of fracture. No dislocation. There is mild narrowing of the first MTP joint with soft tissue swelling in this area.  IMPRESSION: Evidence of soft tissue ulceration volar to the first MTP joint. There is soft tissue swelling in the region of the first MTP joint. There is a subtle area of osteopenia in the medial proximal aspect of the first proximal phalanx. This finding could indicate earliest changes of osteomyelitis. There is no overt bony destruction, however.  There is subtle cortical disruption along the medial aspect of the mid portion of the first proximal phalanx, likely due to avulsion type injury.  There is marked soft tissue swelling dorsally.  No acute fracture or dislocation. There are foci of arterial vascular calcification.  With respect to potential osteomyelitis, MR would be the imaging study of choice to further evaluate. If there is a contraindication to MR, nuclear medicine 3 phase bone scan could be an alternative imaging study to further assess.    Electronically Signed   By: Lowella Grip III M.D.   On: 08/20/2014 15:10   Dg Foot Complete Right  08/20/2014   CLINICAL DATA:  Diabetic wounds to foot.  EXAM: RIGHT FOOT COMPLETE - 3+ VIEW  COMPARISON:  August 17, 2014.  FINDINGS: There is no evidence of fracture or dislocation. Vascular calcifications are noted. No lytic destruction is seen to suggest osteomyelitis. Dorsal soft tissue swelling is noted without radiopaque foreign body.  IMPRESSION: Dorsal soft tissue swelling is noted suggesting cellulitis. No definite evidence of osteomyelitis is noted.   Electronically Signed   By: Marijo Conception, M.D.   On: 08/20/2014 15:12    Scheduled Meds: . ciprofloxacin  400 mg Intravenous Q12H  . enoxaparin (LOVENOX) injection  50 mg Subcutaneous Q24H  . ferrous sulfate  325 mg Oral BID WC  . furosemide  40 mg Oral QHS  . furosemide  80 mg Oral Daily  . insulin aspart  0-9 Units Subcutaneous TID WC  . linagliptin  5 mg Oral Daily  . lisinopril  20 mg Oral Daily  . metFORMIN  1,000 mg Oral BID WC  . metoprolol  100 mg Oral BID  . pantoprazole  40 mg Oral Daily  . potassium chloride  20 mEq Oral BID  . rosuvastatin  20 mg Oral Daily  . triamterene-hydrochlorothiazide  1 tablet Oral Daily  . vancomycin  1,000 mg Intravenous Q12H   Continuous Infusions:    Time spent: 25 minutes    Sandee Bernath  [facility] Pager [phone]. If 7PM-7AM, please contact night-coverage at [email or web address], password [email or web address] 08/21/2014, 12:56 PM  LOS: 1 day

## 2014-08-22 ENCOUNTER — Other Ambulatory Visit: Payer: Self-pay

## 2014-08-22 LAB — BASIC METABOLIC PANEL
Anion gap: 11 (ref 5–15)
BUN: 7 mg/dL (ref 6–23)
CALCIUM: 9.3 mg/dL (ref 8.4–10.5)
CO2: 26 mmol/L (ref 19–32)
Chloride: 100 mmol/L (ref 96–112)
Creatinine, Ser: 0.7 mg/dL (ref 0.50–1.35)
GFR calc Af Amer: >90 mL/min (ref >90)
GFR calc non Af Amer: >90 mL/min (ref >90)
GLUCOSE: 124 mg/dL — AB (ref 70–99)
Potassium: 3.9 mmol/L (ref 3.5–5.1)
Sodium: 137 mmol/L (ref 135–145)

## 2014-08-22 LAB — GLUCOSE, CAPILLARY
GLUCOSE-CAPILLARY: 143 mg/dL — AB (ref 70–99)
Glucose-Capillary: 146 mg/dL — ABNORMAL HIGH (ref 70–99)
Glucose-Capillary: 139 mg/dL — ABNORMAL HIGH (ref 70–99)
Glucose-Capillary: 132 mg/dL — ABNORMAL HIGH (ref 70–99)

## 2014-08-22 LAB — CBC
HEMATOCRIT: 42.5 % (ref 39.0–52.0)
Hemoglobin: 12.5 g/dL — ABNORMAL LOW (ref 13.0–17.0)
MCH: 22.3 pg — AB (ref 26.0–34.0)
MCHC: 29.4 g/dL — ABNORMAL LOW (ref 30.0–36.0)
MCV: 75.9 fL — AB (ref 78.0–100.0)
Platelets: 586 10*3/uL — ABNORMAL HIGH (ref 150–400)
RBC: 5.6 MIL/uL (ref 4.22–5.81)
RDW: 20.1 % — ABNORMAL HIGH (ref 11.5–15.5)
WBC: 19.4 10*3/uL — ABNORMAL HIGH (ref 4.0–10.5)

## 2014-08-22 LAB — VANCOMYCIN, TROUGH: VANCOMYCIN TR: 13.1 ug/mL (ref 10.0–20.0)

## 2014-08-22 MED ORDER — ALBUTEROL SULFATE HFA 108 (90 BASE) MCG/ACT IN AERS: 2.0000 | INHALATION_SPRAY | RESPIRATORY_TRACT | Status: AC | PRN

## 2014-08-22 MED ORDER — ONDANSETRON HCL 4 MG/2ML IJ SOLN
4.0000 mg | Freq: Four times a day (QID) | INTRAMUSCULAR | Status: DC | PRN
  Administered 2014-08-22 – 2014-08-23 (×2): 4 mg via INTRAVENOUS
  Filled 2014-08-22 (×2): qty 2

## 2014-08-22 MED ORDER — DEXTROSE 5 % IV SOLN
2.0000 g | Freq: Three times a day (TID) | INTRAVENOUS | Status: DC
  Administered 2014-08-22 – 2014-08-24 (×6): 2 g via INTRAVENOUS
  Filled 2014-08-22 (×8): qty 2

## 2014-08-22 NOTE — Progress Notes (Signed)
[facility] PROGRESS NOTE  [name] XNA:[id number] DOB: 1941/06/13 DOA: 08/20/2014 PCP: Jenny Reichmann, MD  Summary I have seen and examined [name] at bedside and reviewed his chart. Appreciate orthopedics. [name] is a pleasant 74 y.o. male has a past medical history significant for DM with neuropathy, HTN, HLD, CAD who presented to the ED from [name] office with worsening bilateral LE foot ulcers, and he was found to have diabetic foot infection not responding to doxycycline being given in the outpatient setting. There was concern for osteomyelitis suggested by x-ray but MRI of the right foot did not suggest osteomyelitis, "1. Negative for osteomyelitis or abscess. 2. Plantar great toe ulcer extending into the deep subcutaneous tissues. No abscess. 3. Diffuse forefoot cellulitis". His white count was 19,000 and he had lactic acidosis at the time of admission. He was started on Vancomycin and had bedside debridement on 08/21/14. Blood cultures remain negative to date. His white count remains at 19,400 with 94% neutrophils despite the fact that Ciprofloxacin was added for gram-negative coverage. Will therefore send wound cultures and add Azactam for now. Given the fact that patient is diabetic, need to adequately treat this wound infection. Plan Diabetic foot infection/Leucocytosis  Follow septic workup. Obtain wound culture  Add Azactam to Ciprofloxacin/Vancomycin  Follow orthopedics recommendations CAD (coronary artery disease)/Hypertension/Hyperlipidemia/Neuropathy  No acute changes  Continue current management Code Status: Full Code. Family Communication: No family at bedside Disposition Plan: Eventually home   Consultants:  Orthopedics  Procedures:  Bedside debridement of diabetic foot  Antibiotics:  Vancomycin 08/20/2014>  Ciprofloxacin 08/21/14>  HPI/Subjective: Feels "so, so".  Objective: Filed Vitals:   08/22/14 0638  BP: 154/66   Pulse: 91  Temp: 98.2 F (36.8 C)  Resp: 17    Intake/Output Summary (Last 24 hours) at 08/22/14 1624 Last data filed at 08/22/14 1500  Gross per 24 hour  Intake   1000 ml  Output      0 ml  Net   1000 ml   Filed Weights   08/20/14 2104  Weight: 100.3 kg (221 lb 1.9 oz)    Exam:   General:  Comfortable at rest.  Cardiovascular: S1-S2 normal. No murmurs. Pulse regular.  Respiratory: Good air entry bilaterally. No rhonchi or rales.  Abdomen: Soft and nontender. Normal bowel sounds. No organomegaly.  Musculoskeletal: No pedal edema. Some drainage from left foot wound.  Neurological: Intact  Data Reviewed: Basic Metabolic Panel:  Recent Labs Lab 08/17/14 1005 08/20/14 1300 08/22/14 1212  NA 136 137 137  K 4.1 4.0 3.9  CL 95* 96 100  CO2 32 28 26  GLUCOSE 122* 149* 124*  BUN 16 15 7   CREATININE 0.75 0.76 0.70  CALCIUM 9.1 9.3 9.3   Liver Function Tests:  Recent Labs Lab 08/20/14 1300  AST 25  ALT 9  ALKPHOS 88  BILITOT 1.0  PROT 7.1  ALBUMIN 3.9   No results for input(s): LIPASE, AMYLASE in the last 168 hours. No results for input(s): AMMONIA in the last 168 hours. CBC:  Recent Labs Lab 08/17/14 1018 08/20/14 1300 08/22/14 1212  WBC 19.4* 19.1* 19.4*  NEUTROABS  --  17.4*  --   HGB 11.9* 11.9* 12.5*  HCT 38.4* 41.0 42.5  MCV 71.2* 76.9* 75.9*  PLT  --  630* 586*   Cardiac Enzymes: No results for input(s): CKTOTAL, CKMB, CKMBINDEX, TROPONINI in the last 168 hours. BNP (last 3 results) No results for input(s): BNP in the last 8760  hours.  ProBNP (last 3 results) No results for input(s): PROBNP in the last 8760 hours.  CBG:  Recent Labs Lab 08/21/14 1157 08/21/14 1652 08/21/14 2212 08/22/14 0730 08/22/14 1230  GLUCAP 166* 147* 151* 146* 143*    Recent Results (from the past 240 hour(s))  Wound culture     Status: None   Collection Time: 08/17/14 10:05 AM  Result Value Ref Range Status   Gram Stain No WBC Seen  Final    Gram Stain No Squamous Epithelial Cells Seen  Final   Gram Stain Moderate Gram Positive Cocci In Pairs In Clusters  Final   Gram Stain Few Gram Negative Rods  Final   Organism ID, Bacteria Multiple Organisms Present,None Predominant  Final    Comment: No Staphylococcus aureus isolated NO GROUP A STREP (S. PYOGENES) ISOLATED   Blood culture (routine x 2)     Status: None (Preliminary result)   Collection Time: 08/20/14  2:04 PM  Result Value Ref Range Status   Specimen Description BLOOD RIGHT ARM  Final   Special Requests   Final    BOTTLES DRAWN AEROBIC AND ANAEROBIC 5CC BOTH BOTTLES   Culture   Final           BLOOD CULTURE RECEIVED NO GROWTH TO DATE CULTURE WILL BE HELD FOR 5 DAYS BEFORE ISSUING A FINAL NEGATIVE REPORT Performed at [facility]    Report Status PENDING  Incomplete  Blood culture (routine x 2)     Status: None (Preliminary result)   Collection Time: 08/20/14  2:04 PM  Result Value Ref Range Status   Specimen Description BLOOD LEFT ARM  Final   Special Requests   Final    BOTTLES DRAWN AEROBIC AND ANAEROBIC 5CC BOTH BOTTLES   Culture   Final           BLOOD CULTURE RECEIVED NO GROWTH TO DATE CULTURE WILL BE HELD FOR 5 DAYS BEFORE ISSUING A FINAL NEGATIVE REPORT Performed at [facility]    Report Status PENDING  Incomplete     Studies: Mr Foot Left W Wo Contrast  08/21/2014   CLINICAL DATA:  Bilateral lower extremity foot ulcers. Osteomyelitis.  EXAM: MRI OF THE LEFT FOREFOOT WITHOUT AND WITH CONTRAST  TECHNIQUE: Multiplanar, multisequence MR imaging was performed both before and after administration of intravenous contrast.  CONTRAST:  85mL MULTIHANCE GADOBENATE DIMEGLUMINE 529 MG/ML IV SOLN  COMPARISON:  Radiographs 08/20/2014.  FINDINGS: Diffuse forefoot cellulitis is present. No discrete abscess. There is no osteomyelitis. No septic arthritis. Flexor and extensor tendons are within normal limits. Ulceration is present over the medial plantar  aspect of the great toe. This does not extend a bone. The great toe sesamoids are within normal limits. The ulcer does extend into the subcutaneous tissues, with undermining the subcutaneous fat (image 8 series 11).  IMPRESSION: 1. Negative for osteomyelitis or abscess. 2. Plantar great toe ulcer extending into the deep subcutaneous tissues. No abscess. 3. Diffuse forefoot cellulitis.   Electronically Signed   By: Dereck Ligas M.D.   On: 08/21/2014 08:02    Scheduled Meds: . aztreonam  2 g Intravenous 3 times per day  . ciprofloxacin  400 mg Intravenous Q12H  . enoxaparin (LOVENOX) injection  50 mg Subcutaneous Q24H  . ferrous sulfate  325 mg Oral BID WC  . furosemide  40 mg Oral QHS  . furosemide  80 mg Oral Daily  . insulin aspart  0-9 Units Subcutaneous TID WC  . linagliptin  5 mg Oral Daily  . lisinopril  40 mg Oral Daily  . metFORMIN  1,000 mg Oral BID WC  . metoprolol  100 mg Oral BID  . pantoprazole  40 mg Oral Daily  . potassium chloride  20 mEq Oral BID  . rosuvastatin  20 mg Oral Daily  . triamterene-hydrochlorothiazide  1 tablet Oral Daily  . vancomycin  1,000 mg Intravenous Q12H   Continuous Infusions:    Time spent: 15 minutes    Atha Mcbain  [facility] Pager [phone]. If 7PM-7AM, please contact night-coverage at [email or web address], password [facility] 08/22/2014, 4:24 PM  LOS: 2 days

## 2014-08-22 NOTE — Progress Notes (Signed)
ANTIBIOTIC CONSULT NOTE - FOLLOW UP  Pharmacy Consult for vancomycin Indication: cellulitis  Allergies  Allergen Reactions  . Penicillins Shortness Of Breath    Patient Measurements: Height: 5\' 9"  (175.3 cm) Weight: 221 lb 1.9 oz (100.3 kg) IBW/kg (Calculated) : 70.7  Vital Signs: Temp: 97.8 F (36.6 C) (02/25 1800) Temp Source: Oral (02/25 1800) BP: 153/67 mmHg (02/25 1800) Pulse Rate: 93 (02/25 1800) Intake/Output from previous day: 02/24 0701 - 02/25 0700 In: 720 [P.O.:720] Out: -  Intake/Output from this shift:    Labs:  Recent Labs  08/20/14 1300 08/22/14 1212  WBC 19.1* 19.4*  HGB 11.9* 12.5*  PLT 630* 586*  CREATININE 0.76 0.70   Estimated Creatinine Clearance: 98.8 mL/min (by C-G formula based on Cr of 0.7).  Recent Labs  08/22/14 1930  [place] 13.1     Microbiology: Recent Results (from the past 720 hour(s))  Wound culture     Status: None   Collection Time: 08/17/14 10:05 AM  Result Value Ref Range Status   Gram Stain No WBC Seen  Final   Gram Stain No Squamous Epithelial Cells Seen  Final   Gram Stain Moderate Gram Positive Cocci In Pairs In Clusters  Final   Gram Stain Few Gram Negative Rods  Final   Organism ID, Bacteria Multiple Organisms Present,None Predominant  Final    Comment: No Staphylococcus aureus isolated NO GROUP A STREP (S. PYOGENES) ISOLATED   Blood culture (routine x 2)     Status: None (Preliminary result)   Collection Time: 08/20/14  2:04 PM  Result Value Ref Range Status   Specimen Description BLOOD RIGHT ARM  Final   Special Requests   Final    BOTTLES DRAWN AEROBIC AND ANAEROBIC 5CC BOTH BOTTLES   Culture   Final           BLOOD CULTURE RECEIVED NO GROWTH TO DATE CULTURE WILL BE HELD FOR 5 DAYS BEFORE ISSUING A FINAL NEGATIVE REPORT Performed at [facility]    Report Status PENDING  Incomplete  Blood culture (routine x 2)     Status: None (Preliminary result)   Collection Time: 08/20/14  2:04 PM   Result Value Ref Range Status   Specimen Description BLOOD LEFT ARM  Final   Special Requests   Final    BOTTLES DRAWN AEROBIC AND ANAEROBIC 5CC BOTH BOTTLES   Culture   Final           BLOOD CULTURE RECEIVED NO GROWTH TO DATE CULTURE WILL BE HELD FOR 5 DAYS BEFORE ISSUING A FINAL NEGATIVE REPORT Performed at [facility]    Report Status PENDING  Incomplete    Anti-infectives    Start     Dose/Rate Route Frequency Ordered Stop   08/22/14 1545  aztreonam (AZACTAM) 2 g in dextrose 5 % 50 mL IVPB     2 g 100 mL/hr over 30 Minutes Intravenous 3 times per day 08/22/14 1543     08/21/14 1400  ciprofloxacin (CIPRO) IVPB 400 mg     400 mg 200 mL/hr over 60 Minutes Intravenous Every 12 hours 08/21/14 1240     08/21/14 0700  vancomycin (VANCOCIN) IVPB 1000 mg/200 mL premix     1,000 mg 200 mL/hr over 60 Minutes Intravenous Every 12 hours 08/20/14 1741     08/20/14 1800  vancomycin (VANCOCIN) 2,000 mg in sodium chloride 0.9 % 500 mL IVPB     2,000 mg 250 mL/hr over 120 Minutes Intravenous STAT 08/20/14 1734  08/20/14 2028      Assessment: 74 yo male on vancomycin for cellulitis and diabetic foot ulcers (without evidence of osteomyelitis). A vancomycin trough today was 13.1 at 7:30pm today.  WBC= 19.4, afebrile, SCr= 0.7 and CrCL ~ 100.  vanc 2/23>> Cipro 2/24>.  2/23 blood x2- ngtd  Goal of Therapy:  Vancomycin trough level 10-15 mcg/ml  Plan:  -No changes in vancomycin needed -Will follow plans for length of therapy  Hildred Laser, Sherian Rein D 08/22/2014 8:39 PM

## 2014-08-22 NOTE — Progress Notes (Signed)
Subjective: Pt denies any pain in his feet.  He's eager to go home.   Objective: Vital signs in last 24 hours: Temp:  [98 F (36.7 C)-98.2 F (36.8 C)] 98.2 F (36.8 C) (02/25 9371) Pulse Rate:  [91-98] 91 (02/25 0638) Resp:  [17] 17 (02/25 0638) BP: (154-160)/(66-78) 154/66 mmHg (02/25 0638) SpO2:  [93 %] 93 % (02/25 6967)  Intake/Output from previous day: 02/24 0701 - 02/25 0700 In: 720 [P.O.:720] Out: -  Intake/Output this shift: Total I/O In: 760 [P.O.:360; IV Piggyback:400] Out: -    Recent Labs  08/20/14 1300 08/22/14 1212  HGB 11.9* 12.5*    Recent Labs  08/20/14 1300 08/22/14 1212  WBC 19.1* 19.4*  RBC 5.33 5.60  HCT 41.0 42.5  PLT 630* 586*    Recent Labs  08/20/14 1300 08/22/14 1212  NA 137 137  K 4.0 3.9  CL 96 100  CO2 28 26  BUN 15 7  CREATININE 0.76 0.70  GLUCOSE 149* 124*  CALCIUM 9.3 9.3   No results for input(s): LABPT, INR in the last 72 hours.  PE:  wn wd male in nad.  B feet dressed and dry.  No significant erythema at the feet.  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  Assessment/Plan: Bilat forefoot diabetic ulcers without evidence of osteomyelitis - No indication for surgery at this time.  Pt can be discharged at any time from ortho perspective.  He should f/u with the wound center for management of these diabetic ulcers.  Epic f/u info entered.  I'll sign off.  Please call with any questions.  [phone].   Wylene Simmer 08/22/2014, 4:43 PM

## 2014-08-23 LAB — BASIC METABOLIC PANEL
ANION GAP: 4 — AB (ref 5–15)
BUN: 8 mg/dL (ref 6–23)
CHLORIDE: 98 mmol/L (ref 96–112)
CO2: 31 mmol/L (ref 19–32)
CREATININE: 0.8 mg/dL (ref 0.50–1.35)
Calcium: 8.5 mg/dL (ref 8.4–10.5)
GFR calc Af Amer: >90 mL/min (ref >90)
GFR calc non Af Amer: 88 mL/min — ABNORMAL LOW (ref >90)
Glucose, Bld: 154 mg/dL — ABNORMAL HIGH (ref 70–99)
POTASSIUM: 3.4 mmol/L — AB (ref 3.5–5.1)
Sodium: 133 mmol/L — ABNORMAL LOW (ref 135–145)

## 2014-08-23 LAB — CBC WITH DIFFERENTIAL/PLATELET
Basophils Absolute: 0.1 10*3/uL (ref 0.0–0.1)
Basophils Relative: 0 % (ref 0–1)
EOS PCT: 2 % (ref 0–5)
Eosinophils Absolute: 0.4 10*3/uL (ref 0.0–0.7)
HEMATOCRIT: 38.6 % — AB (ref 39.0–52.0)
HEMOGLOBIN: 11.2 g/dL — AB (ref 13.0–17.0)
LYMPHS ABS: 0.6 10*3/uL — AB (ref 0.7–4.0)
Lymphocytes Relative: 4 % — ABNORMAL LOW (ref 12–46)
MCH: 21.7 pg — ABNORMAL LOW (ref 26.0–34.0)
MCHC: 29 g/dL — ABNORMAL LOW (ref 30.0–36.0)
MCV: 74.8 fL — AB (ref 78.0–100.0)
MONOS PCT: 4 % (ref 3–12)
Monocytes Absolute: 0.6 10*3/uL (ref 0.1–1.0)
Neutro Abs: 14.6 10*3/uL — ABNORMAL HIGH (ref 1.7–7.7)
Neutrophils Relative %: 90 % — ABNORMAL HIGH (ref 43–77)
Platelets: 539 10*3/uL — ABNORMAL HIGH (ref 150–400)
RBC: 5.16 MIL/uL (ref 4.22–5.81)
RDW: 19.9 % — ABNORMAL HIGH (ref 11.5–15.5)
WBC: 16.3 10*3/uL — ABNORMAL HIGH (ref 4.0–10.5)

## 2014-08-23 LAB — GLUCOSE, CAPILLARY
GLUCOSE-CAPILLARY: 145 mg/dL — AB (ref 70–99)
GLUCOSE-CAPILLARY: 133 mg/dL — AB (ref 70–99)
Glucose-Capillary: 160 mg/dL — ABNORMAL HIGH (ref 70–99)
Glucose-Capillary: 96 mg/dL (ref 70–99)

## 2014-08-23 MED ORDER — POTASSIUM CHLORIDE 20 MEQ/15ML (10%) PO SOLN
40.0000 meq | Freq: Once | ORAL | Status: AC
  Administered 2014-08-23: 40 meq via ORAL
  Filled 2014-08-23: qty 30

## 2014-08-23 MED ORDER — CIPROFLOXACIN HCL 500 MG PO TABS
500.0000 mg | ORAL_TABLET | Freq: Two times a day (BID) | ORAL | Status: DC
  Administered 2014-08-23 – 2014-08-24 (×3): 500 mg via ORAL
  Filled 2014-08-23 (×3): qty 1

## 2014-08-23 NOTE — Care Management Note (Signed)
  Page 1 of 1   08/23/2014     2:07:44 PM CARE MANAGEMENT NOTE 08/23/2014  Patient:  [name], [name]   Account Number:  [id number]  Date Initiated:  08/23/2014  Documentation initiated by:  Magdalen Spatz  Subjective/Objective Assessment:     Action/Plan:   Anticipated DC Date:     Anticipated DC Plan:           Choice offered to / List presented to:             Status of service:   Medicare Important Message given?  YES (If response is "NO", the following Medicare IM given date fields will be blank) Date Medicare IM given:  08/23/2014 Medicare IM given by:  Magdalen Spatz Date Additional Medicare IM given:   Additional Medicare IM given by:    Discharge Disposition:    Per UR Regulation:  Reviewed for med. necessity/level of care/duration of stay  If discussed at [facility] of Stay Meetings, dates discussed:    Comments:

## 2014-08-23 NOTE — Progress Notes (Signed)
[facility] PROGRESS NOTE  [name] KMQ:[id number] DOB: [birth year] DOA: 08/20/2014 PCP: Jenny Reichmann, MD  Summary Appreciate orthopedics. [name] is a pleasant 74 y.o. 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 Perfecto Kingdom office with worsening bilateral LE foot ulcers, and he was found to have diabetic foot infection not responding to doxycycline being given in the outpatient setting. There was concern for osteomyelitis suggested by x-ray but MRI of the right foot did not suggest osteomyelitis, "1. Negative for osteomyelitis or abscess. 2. Plantar great toe ulcer extending into the deep subcutaneous tissues. No abscess. 3. Diffuse forefoot cellulitis". His white count was 19,000 and he had lactic acidosis at the time of admission. He was started on Vancomycin and had bedside debridement on 08/21/14. Blood cultures remain negative to date. His white count is 16000 today on Vancomycin/Azactam/Ciprofloxacin. Wound cultures pending. Will continue current antibiotics, hoping there is continued improvement in wbc, and that there is positive yield from wound culture to direct antibiotic choice. Plan Diabetic foot infection/Leucocytosis  Follow septic workup. Follow wound culture  Continue Azactam/Ciprofloxacin/Vancomycin  Follow orthopedics recommendations CAD (coronary artery disease)/Hypertension/Hyperlipidemia/Neuropathy  No acute changes  Continue current management Code Status: Full Code. Family Communication: No family at bedside Disposition Plan: Eventually home   Consultants:  Orthopedics  Procedures:  Bedside debridement of diabetic foot  Antibiotics:  Vancomycin 08/20/2014>  Ciprofloxacin 08/21/14>  Azactam 08/21/14>  HPI/Subjective: Feels "so, so".  Objective: Filed Vitals:   08/23/14 1427  BP: 148/76  Pulse: 94  Temp: 98.2 F (36.8 C)  Resp: 16     Intake/Output Summary (Last 24 hours) at 08/23/14 1827 Last data filed at 08/23/14 1400  Gross per 24 hour  Intake   1060 ml  Output      0 ml  Net   1060 ml   Filed Weights   08/20/14 2104  Weight: 100.3 kg (221 lb 1.9 oz)    Exam:   General:  Comfortable at rest.  Cardiovascular: S1-S2 normal. No murmurs. Pulse regular.  Respiratory: Good air entry bilaterally. No rhonchi or rales.  Abdomen: Soft and nontender. Normal bowel sounds. No organomegaly.  Musculoskeletal: No pedal edema. No drainage from wounds.  Neurological: Intact  Data Reviewed: Basic Metabolic Panel:  Recent Labs Lab 08/17/14 1005 08/20/14 1300 08/22/14 1212 08/23/14 0500  NA 136 137 137 133*  K 4.1 4.0 3.9 3.4*  CL 95* 96 100 98  CO2 32 28 26 31   GLUCOSE 122* 149* 124* 154*  BUN 16 15 7 8   CREATININE 0.75 0.76 0.70 0.80  CALCIUM 9.1 9.3 9.3 8.5   Liver Function Tests:  Recent Labs Lab 08/20/14 1300  AST 25  ALT 9  ALKPHOS 88  BILITOT 1.0  PROT 7.1  ALBUMIN 3.9   No results for input(s): LIPASE, AMYLASE in the last 168 hours. No results for input(s): AMMONIA in the last 168 hours. CBC:  Recent Labs Lab 08/17/14 1018 08/20/14 1300 08/22/14 1212 08/23/14 0500  WBC 19.4* 19.1* 19.4* 16.3*  NEUTROABS  --  17.4*  --  14.6*  HGB 11.9* 11.9* 12.5* 11.2*  HCT 38.4* 41.0 42.5 38.6*  MCV 71.2* 76.9* 75.9* 74.8*  PLT  --  630* 586* 539*   Cardiac Enzymes: No results for input(s): CKTOTAL, CKMB, CKMBINDEX, TROPONINI in the last 168 hours. BNP (last 3 results) No results for input(s): BNP in the last 8760 hours.  ProBNP (  last 3 results) No results for input(s): PROBNP in the last 8760 hours.  CBG:  Recent Labs Lab 08/22/14 1737 08/22/14 2121 08/23/14 0806 08/23/14 1148 08/23/14 1702  GLUCAP 139* 132* 160* 145* 96    Recent Results (from the past 240 hour(s))  Wound culture     Status: None   Collection Time: 08/17/14 10:05 AM  Result Value Ref Range Status    Gram Stain No WBC Seen  Final   Gram Stain No Squamous Epithelial Cells Seen  Final   Gram Stain Moderate Gram Positive Cocci In Pairs In Clusters  Final   Gram Stain Few Gram Negative Rods  Final   Organism ID, Bacteria Multiple Organisms Present,None Predominant  Final    Comment: No Staphylococcus aureus isolated NO GROUP A STREP (S. PYOGENES) ISOLATED   Blood culture (routine x 2)     Status: None (Preliminary result)   Collection Time: 08/20/14  2:04 PM  Result Value Ref Range Status   Specimen Description BLOOD RIGHT ARM  Final   Special Requests   Final    BOTTLES DRAWN AEROBIC AND ANAEROBIC 5CC BOTH BOTTLES   Culture   Final           BLOOD CULTURE RECEIVED NO GROWTH TO DATE CULTURE WILL BE HELD FOR 5 DAYS BEFORE ISSUING A FINAL NEGATIVE REPORT Performed at [facility]    Report Status PENDING  Incomplete  Blood culture (routine x 2)     Status: None (Preliminary result)   Collection Time: 08/20/14  2:04 PM  Result Value Ref Range Status   Specimen Description BLOOD LEFT ARM  Final   Special Requests   Final    BOTTLES DRAWN AEROBIC AND ANAEROBIC 5CC BOTH BOTTLES   Culture   Final           BLOOD CULTURE RECEIVED NO GROWTH TO DATE CULTURE WILL BE HELD FOR 5 DAYS BEFORE ISSUING A FINAL NEGATIVE REPORT Performed at [facility]    Report Status PENDING  Incomplete  Wound culture     Status: None (Preliminary result)   Collection Time: 08/22/14  4:59 PM  Result Value Ref Range Status   Specimen Description WOUND LEFT FOOT  Final   Special Requests NONE  Final   Gram Stain   Final    NO WBC SEEN NO SQUAMOUS EPITHELIAL CELLS SEEN FEW GRAM POSITIVE RODS Performed at [facility]    Culture NO GROWTH Performed at [facility]   Final   Report Status PENDING  Incomplete     Studies: No results found.  Scheduled Meds: . aztreonam  2 g Intravenous 3 times per day  . ciprofloxacin  500 mg Oral BID  . enoxaparin (LOVENOX) injection  50  mg Subcutaneous Q24H  . ferrous sulfate  325 mg Oral BID WC  . furosemide  40 mg Oral QHS  . furosemide  80 mg Oral Daily  . insulin aspart  0-9 Units Subcutaneous TID WC  . linagliptin  5 mg Oral Daily  . lisinopril  40 mg Oral Daily  . metFORMIN  1,000 mg Oral BID WC  . metoprolol  100 mg Oral BID  . pantoprazole  40 mg Oral Daily  . potassium chloride  40 mEq Oral Once  . potassium chloride  20 mEq Oral BID  . rosuvastatin  20 mg Oral Daily  . triamterene-hydrochlorothiazide  1 tablet Oral Daily  . vancomycin  1,000 mg Intravenous Q12H   Continuous Infusions:  Time spent: 20 minutes    [name]  [facility] Pager [phone]. If 7PM-7AM, please contact night-coverage at [email or web address], password [facility] 08/23/2014, 6:27 PM  LOS: 3 days

## 2014-08-23 NOTE — Progress Notes (Signed)
PHARMACIST - PHYSICIAN COMMUNICATION DR:   Sanjuana Letters CONCERNING: Antibiotic IV to Oral Route Change Policy  RECOMMENDATION: This patient is receiving ciprofloxacin by the intravenous route.  Based on criteria approved by the Pharmacy and Therapeutics Committee, the antibiotic(s) is/are being converted to the equivalent oral dose form(s).   DESCRIPTION: These criteria include:  Patient being treated for a respiratory tract infection, urinary tract infection, cellulitis or clostridium difficile associated diarrhea if on metronidazole  The patient is not neutropenic and does not exhibit a GI malabsorption state  The patient is eating (either orally or via tube) and/or has been taking other orally administered medications for a least 24 hours  The patient is improving clinically and has a Tmax < 100.5  If you have questions about this conversion, please contact the Pharmacy Department  []   [phone] )  [facility] Na [x]   [phone] )  [facility]  []   [phone] )  [facility] []   [phone] )  [place]enson, PharmD, BCPS Clinical Pharmacist Pager [phone]

## 2014-08-24 LAB — CBC
HCT: 40.1 % (ref 39.0–52.0)
HEMOGLOBIN: 11.5 g/dL — AB (ref 13.0–17.0)
MCH: 21.7 pg — AB (ref 26.0–34.0)
MCHC: 28.7 g/dL — AB (ref 30.0–36.0)
MCV: 75.8 fL — ABNORMAL LOW (ref 78.0–100.0)
Platelets: 587 10*3/uL — ABNORMAL HIGH (ref 150–400)
RBC: 5.29 MIL/uL (ref 4.22–5.81)
RDW: 20.2 % — ABNORMAL HIGH (ref 11.5–15.5)
WBC: 19.1 10*3/uL — AB (ref 4.0–10.5)

## 2014-08-24 LAB — BASIC METABOLIC PANEL
Anion gap: 9 (ref 5–15)
BUN: 13 mg/dL (ref 6–23)
CO2: 33 mmol/L — ABNORMAL HIGH (ref 19–32)
Calcium: 9.2 mg/dL (ref 8.4–10.5)
Chloride: 96 mmol/L (ref 96–112)
Creatinine, Ser: 0.98 mg/dL (ref 0.50–1.35)
GFR calc Af Amer: >90 mL/min (ref >90)
GFR, EST NON AFRICAN AMERICAN: 81 mL/min — AB (ref >90)
Glucose, Bld: 111 mg/dL — ABNORMAL HIGH (ref 70–99)
Potassium: 4.2 mmol/L (ref 3.5–5.1)
Sodium: 138 mmol/L (ref 135–145)

## 2014-08-24 LAB — GLUCOSE, CAPILLARY
Glucose-Capillary: 112 mg/dL — ABNORMAL HIGH (ref 70–99)
Glucose-Capillary: 125 mg/dL — ABNORMAL HIGH (ref 70–99)

## 2014-08-24 LAB — MAGNESIUM: MAGNESIUM: 1.5 mg/dL (ref 1.5–2.5)

## 2014-08-24 NOTE — Progress Notes (Signed)
Pt given DC instructions for home, wife will pick pt up and lives with pt.  Pt understands to FU with the wound clinic as per Ortho recommendations.  Given # for Dr. Doran Durand as well for FU.  Rewrapped bilat legs and supplies given to pt in case needs to redress at home.  Covered with slippers and ortho shoes that pt wears.  No further questions about home self care.

## 2014-08-24 NOTE — Discharge Summary (Addendum)
Discharge summary [name], is a 74 y.o. male  DOB 1940-11-08  MRN [id number].  Admission date:  08/20/2014  Admitting Physician  Caren Griffins, MD  Discharge Date:  08/24/2014   Primary MD  Jenny Reichmann, MD  Recommendations for primary care physician for things to follow:  Please follow wound culture results/leukocytosis and consider referral to ID if her white count increasing   Admission Diagnosis   Foot pain [M79.673] Osteomyelitis [M86.9] Foot pain, bilateral [M79.671, M79.672]   Discharge Diagnosis  Foot pain [M79.673] Osteomyelitis [M86.9] Foot pain, bilateral [F63.846, M79.672]   Principal Problem:   Diabetic foot infection Active Problems:   CAD (coronary artery disease)   Hypertension   Hyperlipidemia   Neuropathy   [name] is a pleasant 74 y.o. 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 Perfecto Kingdom office with worsening bilateral LE foot ulcers, and he was found to have diabetic foot infection not responding to doxycycline being given in the outpatient setting. There was concern for osteomyelitis suggested by x-ray but MRI of the right foot did not suggest osteomyelitis, "1. Negative for osteomyelitis or abscess. 2. Plantar great toe ulcer extending into the deep subcutaneous tissues. No abscess. 3. Diffuse forefoot cellulitis". His white count was 19,000 and he had lactic acidosis at the time of admission. He was started on Vancomycin and had bedside debridement on 08/21/14. Blood cultures remain negative to date. His white count improved to 16000 but is now back to 19,100 despite Vancomycin/Azactam/Ciprofloxacin. Wound cultures apparently growing multiple organisms. I spoke with Dr. Megan Salon as to whether to change antibiotics or de-escalate.  Recommendation(over the phone), was that it was probably better to hold off on antibiotics for now and have patient follow with wound care/orthopedics, if clear evidence of infection other than just a high white count alone, would then consider antibiotics. I have explained this to the patient who is going to follow with wound care on Monday, 08/26/2014, and if need be have antibiotics restarted accordingly. Patient is also aware that if he is not feeling well, and this may be related to infection and he will need to seek help as necessary. I have also discontinued naproxen and maxide to reduce the risk acute kidney injury as patient already on Lasix/metformin/lisinopril. Will defer to his primary care provider for further adjustments as necessary. Patient was seen by Dr. Doran Durand who perfgomed bedside debridement and did not feel there was infection of the wound.  Discharge Condition Stable.  Consults obtained  None  Follow UP     Discharge Instructions  and  Discharge Medications  Discharge Instructions    Diet - low sodium heart healthy    Complete by:  As directed      Diet Carb Modified    Complete by:  As directed      Discharge wound care:    Complete by:  As directed   Please follow up with wound care, follow Orthopedics recommendations regarding dressings.     Increase activity slowly    Complete by:  As directed             Medication List    STOP taking these medications        doxycycline 100 MG capsule  Commonly known as:  VIBRAMYCIN     naproxen sodium 220 MG tablet  Commonly known as:  ANAPROX     triamterene-hydrochlorothiazide 37.5-25 MG per tablet  Commonly known as:  MAXZIDE-25      TAKE these medications        albuterol 108 (90 BASE) MCG/ACT inhaler  Commonly known as:  PROVENTIL HFA;VENTOLIN HFA  Inhale 2 puffs into the lungs every 4 (four) hours as needed for wheezing or shortness of breath (cough, shortness of breath or wheezing.).     artificial  tears Oint ophthalmic ointment  Place into the right eye every 4 (four) hours as needed for dry eyes.     Co Q 10 100 MG Caps  Take 400 mg by mouth daily.     ferrous sulfate 325 (65 FE) MG tablet  Take 1 tablet (325 mg total) by mouth 2 (two) times daily.     furosemide 40 MG tablet  Commonly known as:  LASIX  Take two 40 mg tablets by mouth in the AM;  Take one 40mg  tablet by m outh in the PM     glucose blood test strip  Commonly known as:  ONE TOUCH ULTRA TEST  Test 1-2 times daily as directed     HYDROcodone-acetaminophen 5-325 MG per tablet  Commonly known as:  NORCO/VICODIN  Take one tablet at night as needed for pain     lisinopril 20 MG tablet  Commonly known as:  PRINIVIL,ZESTRIL  Take 1 tablet (20 mg total) by mouth daily.     Melatonin 5 MG Caps  Take 5 mg by mouth at bedtime.     metFORMIN 500 MG 24 hr tablet  Commonly known as:  GLUCOPHAGE-XR  Take 2 pills (1000mg ) twice daily     metoprolol 100 MG tablet  Commonly known as:  LOPRESSOR  Take 1 tablet (100 mg total) by mouth 2 (two) times daily.     omeprazole 20 MG capsule  Commonly known as:  PRILOSEC  Take 1 capsule (20 mg total) by mouth daily.     potassium chloride SA 20 MEQ tablet  Commonly known as:  K-DUR,KLOR-CON  Take 1 tablet (20 mEq total) by mouth 2 (two) times daily.     rosuvastatin 20 MG tablet  Commonly known as:  CRESTOR  Take 1 tablet (20 mg total) by mouth daily.     silver sulfADIAZINE 1 % cream  Commonly known as:  SILVADENE  Apply 1 application topically 2 (two) times daily.     sitaGLIPtin 100 MG tablet  Commonly known as:  JANUVIA  Take 1 tablet (100 mg total) by mouth daily.     triamcinolone cream 0.1 %  Commonly known as:  KENALOG  Apply topically 2 (two) times daily.        Diet and Activity recommendation: See Discharge Instructions above  Major procedures and Radiology Reports - PLEASE review detailed and final reports for all details, in brief -    Mr  Foot Left W Wo Contrast  08/21/2014   CLINICAL DATA:  Bilateral lower extremity foot ulcers. Osteomyelitis.  EXAM: MRI OF THE LEFT FOREFOOT WITHOUT AND WITH CONTRAST  TECHNIQUE: Multiplanar, multisequence MR imaging was performed both before and after administration of intravenous contrast.  CONTRAST:  32mL MULTIHANCE GADOBENATE DIMEGLUMINE 529 MG/ML IV SOLN  COMPARISON:  Radiographs 08/20/2014.  FINDINGS: Diffuse forefoot cellulitis is present. No discrete abscess. There is no osteomyelitis. No septic arthritis. Flexor and extensor tendons are within normal limits. Ulceration is present over the medial plantar aspect of the great toe. This does not extend a bone.  The great toe sesamoids are within normal limits. The ulcer does extend into the subcutaneous tissues, with undermining the subcutaneous fat (image 8 series 11).  IMPRESSION: 1. Negative for osteomyelitis or abscess. 2. Plantar great toe ulcer extending into the deep subcutaneous tissues. No abscess. 3. Diffuse forefoot cellulitis.   Electronically Signed   By: Dereck Ligas M.D.   On: 08/21/2014 08:02   Dg Foot Complete Left  08/20/2014   CLINICAL DATA:  Plantar surface soft tissue wounds. Diabetes mellitus.  EXAM: LEFT FOOT - COMPLETE 3+ VIEW  COMPARISON:  August 17, 2014  FINDINGS: Frontal, oblique, and lateral views were obtained. There is marked soft tissue swelling dorsally. There is a soft tissue ulceration posterior to the first MTP joint. There is no erosive change. There is no overt bony destruction. However, there is an area of focal osteopenia in the medial aspect of the proximal portion of the first proximal phalanx. There is focal cortical disruption along the medial mid portion of the first proximal phalanx, likely due to an avulsion type injury. No other evidence of fracture. No dislocation. There is mild narrowing of the first MTP joint with soft tissue swelling in this area.  IMPRESSION: Evidence of soft tissue ulceration volar to  the first MTP joint. There is soft tissue swelling in the region of the first MTP joint. There is a subtle area of osteopenia in the medial proximal aspect of the first proximal phalanx. This finding could indicate earliest changes of osteomyelitis. There is no overt bony destruction, however.  There is subtle cortical disruption along the medial aspect of the mid portion of the first proximal phalanx, likely due to avulsion type injury.  There is marked soft tissue swelling dorsally.  No acute fracture or dislocation. There are foci of arterial vascular calcification.  With respect to potential osteomyelitis, MR would be the imaging study of choice to further evaluate. If there is a contraindication to MR, nuclear medicine 3 phase bone scan could be an alternative imaging study to further assess.   Electronically Signed   By: Lowella Grip III M.D.   On: 08/20/2014 15:10   Dg Foot Complete Left  08/17/2014   CLINICAL DATA:  Pain  EXAM: LEFT FOOT - COMPLETE 3+ VIEW  COMPARISON:  None.  FINDINGS: There is no evidence of fracture or dislocation. There is no evidence of arthropathy or other focal bone abnormality. Soft tissues are unremarkable.  IMPRESSION: Negative.   Electronically Signed   By: Kerby Moors M.D.   On: 08/17/2014 11:49   Dg Foot Complete Right  08/20/2014   CLINICAL DATA:  Diabetic wounds to foot.  EXAM: RIGHT FOOT COMPLETE - 3+ VIEW  COMPARISON:  August 17, 2014.  FINDINGS: There is no evidence of fracture or dislocation. Vascular calcifications are noted. No lytic destruction is seen to suggest osteomyelitis. Dorsal soft tissue swelling is noted without radiopaque foreign body.  IMPRESSION: Dorsal soft tissue swelling is noted suggesting cellulitis. No definite evidence of osteomyelitis is noted.   Electronically Signed   By: Marijo Conception, M.D.   On: 08/20/2014 15:12   Dg Foot Complete Right  08/17/2014   CLINICAL DATA:  Diabetic neuropathy, pain  EXAM: RIGHT FOOT COMPLETE - 3+  VIEW  COMPARISON:  08/17/2014 left foot  FINDINGS: Bones appear osteopenic. Mild degenerative changes of the interphalangeal joints and the first MTP joint. No malalignment or fracture. Peripheral atherosclerosis noted. Dorsal foot soft tissue swelling on the lateral view. No plain film  evidence of osteomyelitis.  IMPRESSION: Osteopenia and degenerative changes.  Dorsal foot soft tissue swelling.  No acute osseous finding.   Electronically Signed   By: Jerilynn Mages.  Shick M.D.   On: 08/17/2014 11:29    Micro Results   Recent Results (from the past 240 hour(s))  Wound culture     Status: None   Collection Time: 08/17/14 10:05 AM  Result Value Ref Range Status   Gram Stain No WBC Seen  Final   Gram Stain No Squamous Epithelial Cells Seen  Final   Gram Stain Moderate Gram Positive Cocci In Pairs In Clusters  Final   Gram Stain Few Gram Negative Rods  Final   Organism ID, Bacteria Multiple Organisms Present,None Predominant  Final    Comment: No Staphylococcus aureus isolated NO GROUP A STREP (S. PYOGENES) ISOLATED   Blood culture (routine x 2)     Status: None (Preliminary result)   Collection Time: 08/20/14  2:04 PM  Result Value Ref Range Status   Specimen Description BLOOD RIGHT ARM  Final   Special Requests   Final    BOTTLES DRAWN AEROBIC AND ANAEROBIC 5CC BOTH BOTTLES   Culture   Final           BLOOD CULTURE RECEIVED NO GROWTH TO DATE CULTURE WILL BE HELD FOR 5 DAYS BEFORE ISSUING A FINAL NEGATIVE REPORT Performed at [facility]    Report Status PENDING  Incomplete  Blood culture (routine x 2)     Status: None (Preliminary result)   Collection Time: 08/20/14  2:04 PM  Result Value Ref Range Status   Specimen Description BLOOD LEFT ARM  Final   Special Requests   Final    BOTTLES DRAWN AEROBIC AND ANAEROBIC 5CC BOTH BOTTLES   Culture   Final           BLOOD CULTURE RECEIVED NO GROWTH TO DATE CULTURE WILL BE HELD FOR 5 DAYS BEFORE ISSUING A FINAL NEGATIVE REPORT Performed at  [facility]    Report Status PENDING  Incomplete  Wound culture     Status: None (Preliminary result)   Collection Time: 08/22/14  4:59 PM  Result Value Ref Range Status   Specimen Description WOUND LEFT FOOT  Final   Special Requests NONE  Final   Gram Stain   Final    NO WBC SEEN NO SQUAMOUS EPITHELIAL CELLS SEEN FEW GRAM POSITIVE RODS Performed at [facility]    Culture   Final    MULTIPLE ORGANISMS PRESENT, NONE PREDOMINANT Performed at [facility]    Report Status PENDING  Incomplete       Today   Subjective:   [name] today has no headache,no chest abdominal pain,no new weakness tingling or numbness, feels much better wants to go home today.   Objective:   Blood pressure 108/44, pulse 78, temperature 98.1 F (36.7 C), temperature source Oral, resp. rate 15, height 5\' 9"  (1.753 m), weight 100.3 kg (221 lb 1.9 oz), SpO2 96 %.   Intake/Output Summary (Last 24 hours) at 08/24/14 1113 Last data filed at 08/24/14 0950  Gross per 24 hour  Intake   2002 ml  Output      0 ml  Net   2002 ml    Exam Awake Alert, Oriented x 3, No new F.N deficits, Normal affect [place].AT,PERRAL Supple Neck,No JVD, No cervical lymphadenopathy appriciated.  Symmetrical Chest wall movement, Good air movement bilaterally, CTAB RRR,No Gallops,Rubs or new Murmurs, No Parasternal  Heave +ve B.Sounds, Abd Soft, Non tender, No organomegaly appriciated, No rebound -guarding or rigidity. No Cyanosis, Clubbing or edema, No new Rash or bruise. No drainage from wounds both feet.  Data Review   CBC w Diff: Lab Results  Component Value Date   WBC 19.1* 08/24/2014   WBC 19.4* 08/17/2014   HGB 11.5* 08/24/2014   HGB 11.9* 08/17/2014   HCT 40.1 08/24/2014   HCT 38.4* 08/17/2014   PLT 587* 08/24/2014   LYMPHOPCT 4* 08/23/2014   MONOPCT 4 08/23/2014   EOSPCT 2 08/23/2014   BASOPCT 0 08/23/2014    CMP: Lab Results  Component Value Date   NA 138 08/24/2014   K  4.2 08/24/2014   CL 96 08/24/2014   CO2 33* 08/24/2014   BUN 13 08/24/2014   CREATININE 0.98 08/24/2014   CREATININE 0.75 08/17/2014   PROT 7.1 08/20/2014   ALBUMIN 3.9 08/20/2014   BILITOT 1.0 08/20/2014   ALKPHOS 88 08/20/2014   AST 25 08/20/2014   ALT 9 08/20/2014  .   Total Time in preparing paper work, data evaluation and todays exam - 35 minutes  Genevra Orne M.D on 08/24/2014 at [facility]  [phone]

## 2014-08-25 LAB — WOUND CULTURE: Gram Stain: NONE SEEN

## 2014-08-26 LAB — CULTURE, BLOOD (ROUTINE X 2)
CULTURE: NO GROWTH
Culture: NO GROWTH

## 2014-08-29 ENCOUNTER — Telehealth: Payer: Self-pay | Admitting: Hematology

## 2014-08-29 ENCOUNTER — Ambulatory Visit (HOSPITAL_BASED_OUTPATIENT_CLINIC_OR_DEPARTMENT_OTHER): Payer: Medicare HMO

## 2014-08-29 ENCOUNTER — Ambulatory Visit (HOSPITAL_BASED_OUTPATIENT_CLINIC_OR_DEPARTMENT_OTHER): Payer: Medicare HMO | Admitting: Hematology

## 2014-08-29 ENCOUNTER — Encounter: Payer: Self-pay | Admitting: Hematology

## 2014-08-29 VITALS — BP 154/71 | HR 87 | Temp 98.2°F | Resp 18 | Ht 69.0 in | Wt 223.5 lb

## 2014-08-29 DIAGNOSIS — D649 Anemia, unspecified: Secondary | ICD-10-CM

## 2014-08-29 DIAGNOSIS — D473 Essential (hemorrhagic) thrombocythemia: Secondary | ICD-10-CM

## 2014-08-29 DIAGNOSIS — L03115 Cellulitis of right lower limb: Secondary | ICD-10-CM

## 2014-08-29 DIAGNOSIS — I10 Essential (primary) hypertension: Secondary | ICD-10-CM

## 2014-08-29 DIAGNOSIS — D509 Iron deficiency anemia, unspecified: Secondary | ICD-10-CM

## 2014-08-29 DIAGNOSIS — D75839 Thrombocytosis, unspecified: Secondary | ICD-10-CM

## 2014-08-29 DIAGNOSIS — L03116 Cellulitis of left lower limb: Secondary | ICD-10-CM

## 2014-08-29 DIAGNOSIS — D72829 Elevated white blood cell count, unspecified: Secondary | ICD-10-CM

## 2014-08-29 DIAGNOSIS — E119 Type 2 diabetes mellitus without complications: Secondary | ICD-10-CM

## 2014-08-29 LAB — CBC & DIFF AND RETIC
BASO%: 0.6 % (ref 0.0–2.0)
Basophils Absolute: 0.1 10*3/uL (ref 0.0–0.1)
EOS ABS: 0.5 10*3/uL (ref 0.0–0.5)
EOS%: 2.6 % (ref 0.0–7.0)
HCT: 42.7 % (ref 38.4–49.9)
HGB: 12.6 g/dL — ABNORMAL LOW (ref 13.0–17.1)
IMMATURE RETIC FRACT: 13.4 % — AB (ref 3.00–10.60)
LYMPH#: 0.8 10*3/uL — AB (ref 0.9–3.3)
LYMPH%: 4.2 % — AB (ref 14.0–49.0)
MCH: 22.2 pg — ABNORMAL LOW (ref 27.2–33.4)
MCHC: 29.5 g/dL — ABNORMAL LOW (ref 32.0–36.0)
MCV: 75.3 fL — ABNORMAL LOW (ref 79.3–98.0)
MONO#: 0.6 10*3/uL (ref 0.1–0.9)
MONO%: 3.2 % (ref 0.0–14.0)
NEUT%: 89.4 % — AB (ref 39.0–75.0)
NEUTROS ABS: 16.6 10*3/uL — AB (ref 1.5–6.5)
Platelets: 656 10*3/uL — ABNORMAL HIGH (ref 140–400)
RBC: 5.67 10*6/uL (ref 4.20–5.82)
RDW: 19.6 % — ABNORMAL HIGH (ref 11.0–14.6)
RETIC CT ABS: 223.97 10*3/uL — AB (ref 34.80–93.90)
Retic %: 3.95 % — ABNORMAL HIGH (ref 0.80–1.80)
WBC: 18.6 10*3/uL — AB (ref 4.0–10.3)
nRBC: 0 % (ref 0–0)

## 2014-08-29 LAB — COMPREHENSIVE METABOLIC PANEL (CC13)
ALT: 11 U/L (ref 0–55)
AST: 14 U/L (ref 5–34)
Albumin: 3.9 g/dL (ref 3.5–5.0)
Alkaline Phosphatase: 78 U/L (ref 40–150)
Anion Gap: 10 mEq/L (ref 3–11)
BILIRUBIN TOTAL: 0.77 mg/dL (ref 0.20–1.20)
BUN: 13.9 mg/dL (ref 7.0–26.0)
CO2: 28 mEq/L (ref 22–29)
CREATININE: 0.8 mg/dL (ref 0.7–1.3)
Calcium: 9.4 mg/dL (ref 8.4–10.4)
Chloride: 99 mEq/L (ref 98–109)
EGFR: 88 mL/min/{1.73_m2} — ABNORMAL LOW (ref >90)
GLUCOSE: 101 mg/dL (ref 70–140)
Potassium: 4.1 mEq/L (ref 3.5–5.1)
Sodium: 137 mEq/L (ref 136–145)
TOTAL PROTEIN: 6.7 g/dL (ref 6.4–8.3)

## 2014-08-29 LAB — LACTATE DEHYDROGENASE (CC13): LDH: 290 U/L — AB (ref 125–245)

## 2014-08-29 LAB — MORPHOLOGY: PLT EST: INCREASED

## 2014-08-29 NOTE — Telephone Encounter (Signed)
gv and printed appt sched and avs for pt for April  °

## 2014-08-29 NOTE — Progress Notes (Signed)
[facility]  Telephone:([phone]) [phone] [phone]) [phone]  Clinic New consult Note   Patient Care Team: Darlyne Russian, MD as PCP - General (Family Medicine) 08/29/2014  CHIEF COMPLAINTS/PURPOSE OF CONSULTATION:  Leukocytosis and thrombocytosis  Referring physician: Dr. Everlene Farrier  HISTORY OF PRESENTING ILLNESS:  [name] 74 y.o. male with medical history significant for DM, peripheral neuropathy and the recent lower extremity wounds, HTN, HLD, CAD, is being referred here to evaluate his abnormal CBC.  He developed bilateral lower extremity swollen, skin erythema, and ulcers on right calf in early January. He was evaluated by his primary care physician, and treated with a course of antibiotics for presumed cellulitis, but these symptoms did not improve significantly. He then developed multiple ulcers on the bottoms of his postoperative feet about 2-3 weeks ago,  without significant pain, no fever or chills, he was sent by his primary care physician from office to emergency room to ruled out osteomyelitis. MRI of the right foot did not suggest osteomyelitis. His white count was 19,000, hemoglobin 11.9, MCV 76.9, a platelet 630K. He was started on Vancomycin and had bedside debridement on 08/21/14. Blood cultures was negative. Wound cultures apparently growing multiple organisms. He was discharged home on 08/24/14 without oral antibiotics.   He followed with wound care on Monday, 08/26/2014, and  was restarted on doxycycline. His lower extremity swollen and skin erythema has significantly improved. The ulcer on the right calf has healed. The ulcers on the bottom of feet has significantly improved also.   Our medical records showed he had normal WBC and platelet count up to January 2015. Patient states he had on-and-off anemia for several years in the past, he was told he had iron deficient anemia and was taking iron pill on and off. He did however have elevated hemoglobin with the  highest 18.3 with hematocrit 56.2 in November 2013.   MEDICAL HISTORY:  Past Medical History  Diagnosis Date  . Allergy   . Arthritis   . Diabetes mellitus without complication   . Hypertension   . CHF (congestive heart failure)   . GERD (gastroesophageal reflux disease)   . Hyperlipidemia     SURGICAL HISTORY: Past Surgical History  Procedure Laterality Date  . Joint replacement    . Spine surgery    . Heart stent  2002    SOCIAL HISTORY: History   Social History  . Marital Status: Married    Spouse Name: N/A  . Number of Children: N/A  . Years of Education: N/A   Occupational History  . Not on file.   Social History Main Topics  . Smoking status: Former Smoker -- 2.00 packs/day for 15 years    Types: Cigarettes    Quit date: 08/31/2001  . Smokeless tobacco: Never Used  . Alcohol Use: Yes     Comment: occ  . Drug Use: No  . Sexual Activity: No   Other Topics Concern  . Not on file   Social History Narrative    FAMILY HISTORY: Family History  Problem Relation Age of Onset  . Cancer Mother     breast    ALLERGIES:  is allergic to penicillins.  MEDICATIONS:  Current Outpatient Prescriptions  Medication Sig Dispense Refill  . albuterol (PROVENTIL HFA;VENTOLIN HFA) 108 (90 BASE) MCG/ACT inhaler Inhale 2 puffs into the lungs every 4 (four) hours as needed for wheezing or shortness of breath (cough, shortness of breath or wheezing.). 1 Inhaler 3  . Coenzyme Q10 (CO Q  10) 100 MG CAPS Take 400 mg by mouth daily.    . ferrous sulfate 325 (65 FE) MG tablet Take 1 tablet (325 mg total) by mouth 2 (two) times daily. 180 tablet 3  . furosemide (LASIX) 40 MG tablet Take two 40 mg tablets by mouth in the AM;  Take one 22m tablet by m outh in the PM 270 tablet 3  . glucose blood (ONE TOUCH ULTRA TEST) test strip Test 1-2 times daily as directed 100 each 0  . lisinopril (PRINIVIL,ZESTRIL) 20 MG tablet Take 1 tablet (20 mg total) by mouth daily. (Patient taking  differently: Take 40 mg by mouth daily with breakfast. ) 90 tablet 3  . Melatonin 5 MG CAPS Take 5 mg by mouth at bedtime.     . metFORMIN (GLUCOPHAGE-XR) 500 MG 24 hr tablet Take 2 pills (10074m twice daily (Patient taking differently: Take 1,000 mg by mouth 2 (two) times daily. ) 360 tablet 3  . metoprolol (LOPRESSOR) 100 MG tablet Take 1 tablet (100 mg total) by mouth 2 (two) times daily. 180 tablet 3  . naproxen sodium (ANAPROX) 220 MG tablet Take 440 mg by mouth 2 (two) times daily with a meal.    . omeprazole (PRILOSEC) 20 MG capsule Take 1 capsule (20 mg total) by mouth daily. 90 capsule 3  . potassium chloride SA (K-DUR,KLOR-CON) 20 MEQ tablet Take 1 tablet (20 mEq total) by mouth 2 (two) times daily. 180 tablet 3  . rosuvastatin (CRESTOR) 20 MG tablet Take 1 tablet (20 mg total) by mouth daily. 90 tablet 3  . silver sulfADIAZINE (SILVADENE) 1 % cream Apply 1 application topically 2 (two) times daily. 400 g 0  . sitaGLIPtin (JANUVIA) 100 MG tablet Take 1 tablet (100 mg total) by mouth daily. 90 tablet 3  . triamcinolone cream (KENALOG) 0.1 % Apply topically 2 (two) times daily. (Patient taking differently: Apply 1 application topically 2 (two) times daily as needed (for arthritis and itching). ) 480 g 0  . triamterene-hydrochlorothiazide (MAXZIDE-25) 37.5-25 MG per tablet Take 1 tablet by mouth daily.    . Marland KitchenYDROcodone-acetaminophen (NORCO/VICODIN) 5-325 MG per tablet Take one tablet at night as needed for pain (Patient not taking: Reported on 08/29/2014) 30 tablet 0   No current facility-administered medications for this visit.    REVIEW OF SYSTEMS:   Constitutional: Denies fevers, chills or abnormal night sweats Eyes: Denies blurriness of vision, double vision or watery eyes Ears, nose, mouth, throat, and face: Denies mucositis or sore throat Respiratory: Denies cough, dyspnea or wheezes Cardiovascular: Denies palpitation, chest discomfort or lower extremity swelling Gastrointestinal:   Denies nausea, heartburn or change in bowel habits Skin: Denies abnormal skin rashes Lymphatics: Denies new lymphadenopathy or easy bruising Neurological:Denies numbness, tingling or new weaknesses Behavioral/Psych: Mood is stable, no new changes  All other systems were reviewed with the patient and are negative.  PHYSICAL EXAMINATION: ECOG PERFORMANCE STATUS: 1 - Symptomatic but completely ambulatory  Filed Vitals:   08/29/14 1352  BP: 154/71  Pulse: 87  Temp: 98.2 F (36.8 C)  Resp: 18   Filed Weights   08/29/14 1352  Weight: 223 lb 8 oz (101.379 kg)    GENERAL:alert, no distress and comfortable SKIN: skin color, texture, turgor are normal, no rashes or significant lesions EYES: normal, conjunctiva are pink and non-injected, sclera clear OROPHARYNX:no exudate, no erythema and lips, buccal mucosa, and tongue normal  NECK: supple, thyroid normal size, non-tender, without nodularity LYMPH:  no palpable lymphadenopathy in the  cervical, axillary or inguinal LUNGS: clear to auscultation and percussion with normal breathing effort HEART: regular rate & rhythm and no murmurs and no lower extremity edema ABDOMEN:abdomen soft, non-tender and normal bowel sounds Musculoskeletal:no cyanosis of digits and no clubbing  PSYCH: alert & oriented x 3 with fluent speech NEURO: no focal motor/sensory deficits  LABORATORY DATA:  I have reviewed the data as listed CBC Latest Ref Rng 08/24/2014 08/23/2014 08/22/2014  WBC 4.0 - 10.5 K/uL 19.1(H) 16.3(H) 19.4(H)  Hemoglobin 13.0 - 17.0 g/dL 11.5(L) 11.2(L) 12.5(L)  Hematocrit 39.0 - 52.0 % 40.1 38.6(L) 42.5  Platelets 150 - 400 K/uL 587(H) 539(H) 586(H)    CMP Latest Ref Rng 08/24/2014 08/23/2014 08/22/2014  Glucose 70 - 99 mg/dL 111(H) 154(H) 124(H)  BUN 6 - 23 mg/dL 13 8 7   Creatinine 0.50 - 1.35 mg/dL 0.98 0.80 0.70  Sodium 135 - 145 mmol/L 138 133(L) 137  Potassium 3.5 - 5.1 mmol/L 4.2 3.4(L) 3.9  Chloride 96 - 112 mmol/L 96 98 100  CO2 19  - 32 mmol/L 33(H) 31 26  Calcium 8.4 - 10.5 mg/dL 9.2 8.5 9.3  Total Protein 6.0 - 8.3 g/dL - - -  Total Bilirubin 0.3 - 1.2 mg/dL - - -  Alkaline Phos 39 - 117 U/L - - -  AST 0 - 37 U/L - - -  ALT 0 - 53 U/L - - -     RADIOGRAPHIC STUDIES: MRI abdomen with and without contrast 05/29/2012 IMPRESSION:  1. Structure within the right hepatic lobe has signal and enhancement characteristics compatible with benign hemangioma. 2. Gallstones. 3. Splenomegaly.    ASSESSMENT & PLAN:  74 year old gentleman with past medical history of diabetes, peripheral neuropathy, hypertension, coronary artery disease, recent bilateral foot wounds and cellulitis, presents with leukocytosis, thrombocytosis and anemia. He did have polycythemia in 2013 also.  1. Leukocytosis and thrombocytosis -His leukocytosis is most neutrophils, no blasts or elevated eosinophil or basophils. -This could be reactive, secondary to his recent bilateral lower extremity cellulitis, bilateral foot wounds with infections.  -He also had a history of iron deficiency, his thrombocytosis could be reactive due to that. -However giving the persistent leukocytosis and thrombocytosis, after significant improvement in his cellulitis and wound infection, in light of the history of polycythemia in 2013, I think myeloproliferative neoplasm  should BE ruled out. -I'll check BCR/ABL, JAK2 mutation, and p.m. L5 mutation. If negative, I'll consider a bone marrow biopsy. Ultrasound of abdomen to evaluate his liver and spleen.  2. Microcytic Anemia -He had history of iron deficiency and significantly low ferritin in the past -I'll repeat his iron study, ferritin, we'll also check folic acid, U44, SPEP/UPEP with immunofixation. -Stool OB -If evidence of iron deficiency, I'll consider IV iron, due to his lack of response to oral iron supplements.  3. He will continue follow-up with his primary care physician and wound clinic for his  diabetes, hypertension, coronary artery disease and wound infection.  Paln -lab tests today -[place] of abdomen  -I will call you if your iron level is low -RTC in one month with repeated CBC, and we decide if yo need a bone marrow biopsy   All questions were answered. The patient knows to call the clinic with any problems, questions or concerns. I spent 40 minutes counseling the patient face to face. The total time spent in the appointment was 55 minutes and more than 50% was on counseling.     Truitt Merle, MD 08/29/2014 2:18 PM

## 2014-08-30 LAB — FERRITIN CHCC: FERRITIN: 11 ng/mL — AB (ref 22–316)

## 2014-08-30 LAB — IRON AND TIBC CHCC
%SAT: 5 % — AB (ref 20–55)
IRON: 20 ug/dL — AB (ref 42–163)
TIBC: 395 ug/dL (ref 202–409)
UIBC: 375 ug/dL (ref 117–376)

## 2014-09-02 LAB — SPEP & IFE WITH QIG
ALBUMIN ELP: 59.9 % (ref 55.8–66.1)
ALPHA-1-GLOBULIN: 4.8 % (ref 2.9–4.9)
ALPHA-2-GLOBULIN: 9.3 % (ref 7.1–11.8)
BETA 2: 4.4 % (ref 3.2–6.5)
Beta Globulin: 7.4 % — ABNORMAL HIGH (ref 4.7–7.2)
GAMMA GLOBULIN: 14.2 % (ref 11.1–18.8)
IgA: 242 mg/dL (ref 68–379)
IgG (Immunoglobin G), Serum: 1100 mg/dL (ref 650–1600)
IgM, Serum: 108 mg/dL (ref 41–251)
Total Protein, Serum Electrophoresis: 6.9 g/dL (ref 6.0–8.3)

## 2014-09-02 LAB — SEDIMENTATION RATE: Sed Rate: 1 mm/hr (ref 0–20)

## 2014-09-02 LAB — FOLATE RBC: RBC Folate: >1000 ng/mL (ref >280)

## 2014-09-02 LAB — VITAMIN B12: VITAMIN B 12: 382 pg/mL (ref 211–911)

## 2014-09-02 LAB — ERYTHROPOIETIN: ERYTHROPOIETIN: 23.3 m[IU]/mL — AB (ref 2.6–18.5)

## 2014-09-03 ENCOUNTER — Telehealth: Payer: Self-pay | Admitting: *Deleted

## 2014-09-03 ENCOUNTER — Telehealth: Payer: Self-pay | Admitting: Hematology

## 2014-09-03 DIAGNOSIS — D509 Iron deficiency anemia, unspecified: Secondary | ICD-10-CM | POA: Insufficient documentation

## 2014-09-03 NOTE — Telephone Encounter (Signed)
Called pt at home and spoke with wife.  Instructed wife re: pt has low iron.  Dr. Burr Medico will set up iron infusion for pt the next 2 Fridays.  Informed wife that a scheduler will contact pt with date and time for appts.  Wife voiced understanding and stated she would relay message to pt.

## 2014-09-03 NOTE — Telephone Encounter (Signed)
Per staff message and POF I have scheduled appts. Advised scheduler of appts. JMW  

## 2014-09-03 NOTE — Telephone Encounter (Signed)
Confirm appointment for 03/11

## 2014-09-04 ENCOUNTER — Ambulatory Visit (HOSPITAL_COMMUNITY): Payer: Medicare HMO

## 2014-09-06 ENCOUNTER — Ambulatory Visit (HOSPITAL_COMMUNITY)
Admission: RE | Admit: 2014-09-06 | Discharge: 2014-09-06 | Disposition: A | Discharge status: Home / Self Care | Payer: Medicare HMO | Source: Ambulatory Visit | Attending: Hematology | Admitting: Hematology

## 2014-09-06 ENCOUNTER — Ambulatory Visit (HOSPITAL_BASED_OUTPATIENT_CLINIC_OR_DEPARTMENT_OTHER): Payer: Medicare HMO

## 2014-09-06 DIAGNOSIS — D75839 Thrombocytosis, unspecified: Secondary | ICD-10-CM

## 2014-09-06 DIAGNOSIS — D649 Anemia, unspecified: Secondary | ICD-10-CM | POA: Insufficient documentation

## 2014-09-06 DIAGNOSIS — D473 Essential (hemorrhagic) thrombocythemia: Secondary | ICD-10-CM | POA: Diagnosis not present

## 2014-09-06 DIAGNOSIS — D509 Iron deficiency anemia, unspecified: Secondary | ICD-10-CM

## 2014-09-06 DIAGNOSIS — D72829 Elevated white blood cell count, unspecified: Secondary | ICD-10-CM | POA: Insufficient documentation

## 2014-09-06 MED ORDER — SODIUM CHLORIDE 0.9 % IV SOLN
Freq: Once | INTRAVENOUS | Status: AC
  Administered 2014-09-06: 10:00:00 via INTRAVENOUS

## 2014-09-06 MED ORDER — SODIUM CHLORIDE 0.9 % IV SOLN
510.0000 mg | Freq: Once | INTRAVENOUS | Status: AC
  Administered 2014-09-06: 510 mg via INTRAVENOUS
  Filled 2014-09-06: qty 17

## 2014-09-06 NOTE — Patient Instructions (Signed)

## 2014-09-13 ENCOUNTER — Ambulatory Visit (HOSPITAL_BASED_OUTPATIENT_CLINIC_OR_DEPARTMENT_OTHER): Payer: Medicare HMO

## 2014-09-13 DIAGNOSIS — D509 Iron deficiency anemia, unspecified: Secondary | ICD-10-CM

## 2014-09-13 MED ORDER — SODIUM CHLORIDE 0.9 % IV SOLN
510.0000 mg | Freq: Once | INTRAVENOUS | Status: AC
  Administered 2014-09-13: 510 mg via INTRAVENOUS
  Filled 2014-09-13: qty 17

## 2014-09-13 MED ORDER — SODIUM CHLORIDE 0.9 % IV SOLN
Freq: Once | INTRAVENOUS | Status: AC
  Administered 2014-09-13: 14:00:00 via INTRAVENOUS

## 2014-09-13 NOTE — Patient Instructions (Signed)

## 2014-09-24 ENCOUNTER — Other Ambulatory Visit (HOSPITAL_BASED_OUTPATIENT_CLINIC_OR_DEPARTMENT_OTHER): Payer: Medicare HMO

## 2014-09-24 DIAGNOSIS — D473 Essential (hemorrhagic) thrombocythemia: Secondary | ICD-10-CM

## 2014-09-24 DIAGNOSIS — D75839 Thrombocytosis, unspecified: Secondary | ICD-10-CM

## 2014-09-24 DIAGNOSIS — D509 Iron deficiency anemia, unspecified: Secondary | ICD-10-CM

## 2014-09-24 DIAGNOSIS — D72829 Elevated white blood cell count, unspecified: Secondary | ICD-10-CM

## 2014-09-24 DIAGNOSIS — D649 Anemia, unspecified: Secondary | ICD-10-CM

## 2014-09-24 LAB — FECAL OCCULT BLOOD, GUAIAC: Occult Blood: NEGATIVE

## 2014-09-28 LAB — UIFE/LIGHT CHAINS/TP QN, 24-HR UR
ALPHA 1 UR: DETECTED — AB
ALPHA 2 UR: DETECTED — AB
Albumin, U: DETECTED
Beta, Urine: DETECTED — AB
Gamma Globulin, Urine: DETECTED — AB
TIME-UPE24: 24 h
Total Protein, Urine-Ur/day: 378 mg/d — ABNORMAL HIGH (ref <150)
Total Protein, Urine: 18 mg/dL (ref 5–25)
Volume, Urine: 2100 mL

## 2014-09-28 LAB — 24 HR URINE,KAPPA/LAMBDA LIGHT CHAINS
24H Urine Volume: 2100 mL/24 h
Measured Kappa Chain: 0.68 mg/dL (ref <2.00)
TOTAL KAPPA CHAIN: 14.28 mg/(24.h)

## 2014-10-04 ENCOUNTER — Other Ambulatory Visit (HOSPITAL_BASED_OUTPATIENT_CLINIC_OR_DEPARTMENT_OTHER): Payer: Medicare HMO

## 2014-10-04 ENCOUNTER — Ambulatory Visit (HOSPITAL_BASED_OUTPATIENT_CLINIC_OR_DEPARTMENT_OTHER): Payer: Medicare HMO | Admitting: Hematology

## 2014-10-04 ENCOUNTER — Telehealth: Payer: Self-pay | Admitting: Hematology

## 2014-10-04 VITALS — BP 165/59 | HR 98 | Temp 99.2°F | Resp 19 | Ht 69.0 in | Wt 225.7 lb

## 2014-10-04 DIAGNOSIS — D72829 Elevated white blood cell count, unspecified: Secondary | ICD-10-CM

## 2014-10-04 DIAGNOSIS — D649 Anemia, unspecified: Secondary | ICD-10-CM

## 2014-10-04 DIAGNOSIS — D509 Iron deficiency anemia, unspecified: Secondary | ICD-10-CM | POA: Diagnosis not present

## 2014-10-04 DIAGNOSIS — D75839 Thrombocytosis, unspecified: Secondary | ICD-10-CM

## 2014-10-04 DIAGNOSIS — D471 Chronic myeloproliferative disease: Secondary | ICD-10-CM | POA: Diagnosis not present

## 2014-10-04 DIAGNOSIS — D473 Essential (hemorrhagic) thrombocythemia: Secondary | ICD-10-CM

## 2014-10-04 LAB — CBC WITH DIFFERENTIAL/PLATELET
BASO%: 0.2 % (ref 0.0–2.0)
Basophils Absolute: 0.1 10*3/uL (ref 0.0–0.1)
EOS ABS: 0.2 10*3/uL (ref 0.0–0.5)
EOS%: 1 % (ref 0.0–7.0)
HCT: 43 % (ref 38.4–49.9)
HGB: 13.6 g/dL (ref 13.0–17.1)
LYMPH%: 1.8 % — ABNORMAL LOW (ref 14.0–49.0)
MCH: 25.3 pg — ABNORMAL LOW (ref 27.2–33.4)
MCHC: 31.7 g/dL — AB (ref 32.0–36.0)
MCV: 79.8 fL (ref 79.3–98.0)
MONO#: 0.8 10*3/uL (ref 0.1–0.9)
MONO%: 3.5 % (ref 0.0–14.0)
NEUT#: 22.2 10*3/uL — ABNORMAL HIGH (ref 1.5–6.5)
NEUT%: 93.5 % — ABNORMAL HIGH (ref 39.0–75.0)
Platelets: 611 10*3/uL — ABNORMAL HIGH (ref 140–400)
RBC: 5.39 10*6/uL (ref 4.20–5.82)
RDW: 27.2 % — ABNORMAL HIGH (ref 11.0–14.6)
WBC: 23.7 10*3/uL — AB (ref 4.0–10.3)
lymph#: 0.4 10*3/uL — ABNORMAL LOW (ref 0.9–3.3)

## 2014-10-04 LAB — TECHNOLOGIST REVIEW

## 2014-10-04 NOTE — Progress Notes (Signed)
[facility]  Telephone:(336) [phone] Fax:(336) [phone]  Clinic New consult Note   Patient Care Team: Darlyne Russian, MD as PCP - General (Family Medicine) 10/04/2014  CHIEF COMPLAINTS:  Myeloproliferative neoplasm  Referring physician: Dr. Everlene Farrier  HISTORY OF PRESENTING ILLNESS:  [name] 74 y.o. male with medical history significant for DM, peripheral neuropathy and the recent lower extremity wounds, HTN, HLD, CAD, is being referred here to evaluate his abnormal CBC.  He developed bilateral lower extremity swollen, skin erythema, and ulcers on right calf in early January. He was evaluated by his primary care physician, and treated with a course of antibiotics for presumed cellulitis, but these symptoms did not improve significantly. He then developed multiple ulcers on the bottoms of his postoperative feet about 2-3 weeks ago,  without significant pain, no fever or chills, he was sent by his primary care physician from office to emergency room to ruled out osteomyelitis. MRI of the right foot did not suggest osteomyelitis. His white count was 19,000, hemoglobin 11.9, MCV 76.9, a platelet 630K. He was started on Vancomycin and had bedside debridement on 08/21/14. Blood cultures was negative. Wound cultures apparently growing multiple organisms. He was discharged home on 08/24/14 without oral antibiotics.   He followed with wound care on Monday, 08/26/2014, and  was restarted on doxycycline. His lower extremity swollen and skin erythema has significantly improved. The ulcer on the right calf has healed. The ulcers on the bottom of feet has significantly improved also.   Our medical records showed he had normal WBC and platelet count up to January 2015. Patient states he had on-and-off anemia for several years in the past, he was told he had iron deficient anemia and was taking iron pill on and off. He did however have elevated hemoglobin with the highest 18.3 with hematocrit  56.2 in November 2013.  INTERIM HISTORY Mr. [name] returns for follow-up. He comes in with his son in a wheelchair. His foot wound has been healing well, and is much smaller than before now. He otherwise no other new complaints. He denies significant pain, abdominal discomfort or other complaints. He uses a wheelchair most of time due to his foot wound, but does use a walker for short distance.   MEDICAL HISTORY:  Past Medical History  Diagnosis Date  . Allergy   . Arthritis   . Diabetes mellitus without complication   . Hypertension   . CHF (congestive heart failure)   . GERD (gastroesophageal reflux disease)   . Hyperlipidemia   . CAD (coronary artery disease)     s/p stent placement 2002    SURGICAL HISTORY: Past Surgical History  Procedure Laterality Date  . Joint replacement    . Spine surgery    . Heart stent  2002    SOCIAL HISTORY: History   Social History  . Marital Status: Married    Spouse Name: N/A  . Number of Children: N/A  . Years of Education: N/A   Occupational History  . Not on file.   Social History Main Topics  . Smoking status: Former Smoker -- 2.00 packs/day for 25 years    Types: Cigarettes    Quit date: 08/31/2001  . Smokeless tobacco: Never Used  . Alcohol Use: No     Comment: occ  . Drug Use: No  . Sexual Activity: No   Other Topics Concern  . Not on file   Social History Narrative    FAMILY HISTORY: Family History  Problem  Relation Age of Onset  . Cancer Mother     breast  . Diabetes Brother   . Hypertension Brother     ALLERGIES:  is allergic to penicillins.  MEDICATIONS:  Current Outpatient Prescriptions  Medication Sig Dispense Refill  . albuterol (PROVENTIL HFA;VENTOLIN HFA) 108 (90 BASE) MCG/ACT inhaler Inhale 2 puffs into the lungs every 4 (four) hours as needed for wheezing or shortness of breath (cough, shortness of breath or wheezing.). 1 Inhaler 3  . Coenzyme Q10 (CO Q 10) 100 MG CAPS Take 400 mg by mouth  daily.    . ferrous sulfate 325 (65 FE) MG tablet Take 1 tablet (325 mg total) by mouth 2 (two) times daily. 180 tablet 3  . furosemide (LASIX) 40 MG tablet Take two 40 mg tablets by mouth in the AM;  Take one 78m tablet by m outh in the PM 270 tablet 3  . glucose blood (ONE TOUCH ULTRA TEST) test strip Test 1-2 times daily as directed 100 each 0  . HYDROcodone-acetaminophen (NORCO/VICODIN) 5-325 MG per tablet Take one tablet at night as needed for pain (Patient not taking: Reported on 08/29/2014) 30 tablet 0  . lisinopril (PRINIVIL,ZESTRIL) 20 MG tablet Take 1 tablet (20 mg total) by mouth daily. (Patient taking differently: Take 40 mg by mouth daily with breakfast. ) 90 tablet 3  . Melatonin 5 MG CAPS Take 5 mg by mouth at bedtime.     . metFORMIN (GLUCOPHAGE-XR) 500 MG 24 hr tablet Take 2 pills (1004m twice daily (Patient taking differently: Take 1,000 mg by mouth 2 (two) times daily. ) 360 tablet 3  . metoprolol (LOPRESSOR) 100 MG tablet Take 1 tablet (100 mg total) by mouth 2 (two) times daily. 180 tablet 3  . naproxen sodium (ANAPROX) 220 MG tablet Take 440 mg by mouth 2 (two) times daily with a meal.    . omeprazole (PRILOSEC) 20 MG capsule Take 1 capsule (20 mg total) by mouth daily. 90 capsule 3  . potassium chloride SA (K-DUR,KLOR-CON) 20 MEQ tablet Take 1 tablet (20 mEq total) by mouth 2 (two) times daily. 180 tablet 3  . rosuvastatin (CRESTOR) 20 MG tablet Take 1 tablet (20 mg total) by mouth daily. 90 tablet 3  . silver sulfADIAZINE (SILVADENE) 1 % cream Apply 1 application topically 2 (two) times daily. 400 g 0  . sitaGLIPtin (JANUVIA) 100 MG tablet Take 1 tablet (100 mg total) by mouth daily. 90 tablet 3  . triamcinolone cream (KENALOG) 0.1 % Apply topically 2 (two) times daily. (Patient taking differently: Apply 1 application topically 2 (two) times daily as needed (for arthritis and itching). ) 480 g 0  . triamterene-hydrochlorothiazide (MAXZIDE-25) 37.5-25 MG per tablet Take 1  tablet by mouth daily.     No current facility-administered medications for this visit.    REVIEW OF SYSTEMS:   Constitutional: Denies fevers, chills or abnormal night sweats Eyes: Denies blurriness of vision, double vision or watery eyes Ears, nose, mouth, throat, and face: Denies mucositis or sore throat Respiratory: Denies cough, dyspnea or wheezes Cardiovascular: Denies palpitation, chest discomfort or lower extremity swelling Gastrointestinal:  Denies nausea, heartburn or change in bowel habits Skin: Denies abnormal skin rashes Lymphatics: Denies new lymphadenopathy or easy bruising Neurological:Denies numbness, tingling or new weaknesses  Behavioral/Psych: Mood is stable, no new changes  All other systems were reviewed with the patient and are negative.  PHYSICAL EXAMINATION: ECOG PERFORMANCE STATUS: 1 - Symptomatic but completely ambulatory  Filed Vitals:   10/04/14  0940  BP: 165/59  Pulse: 98  Temp: 99.2 F (37.3 C)  Resp: 19   Filed Weights   10/04/14 0940  Weight: 225 lb 11.2 oz (102.377 kg)    GENERAL:alert, no distress and comfortable SKIN: skin color, texture, turgor are normal, no rashes or significant lesions EYES: normal, conjunctiva are pink and non-injected, sclera clear OROPHARYNX:no exudate, no erythema and lips, buccal mucosa, and tongue normal  NECK: supple, thyroid normal size, non-tender, without nodularity LYMPH:  no palpable lymphadenopathy in the cervical, axillary or inguinal LUNGS: clear to auscultation and percussion with normal breathing effort HEART: regular rate & rhythm and no murmurs and no lower extremity edema ABDOMEN:abdomen soft, non-tender and normal bowel sounds Musculoskeletal:no cyanosis of digits and no clubbing  PSYCH: alert & oriented x 3 with fluent speech NEURO: no focal motor/sensory deficits Ext: Bilateral feet are wrapped with gauze  LABORATORY DATA:  I have reviewed the data as listed CBC Latest Ref Rng 10/04/2014  08/29/2014 08/24/2014  WBC 4.0 - 10.3 10e3/uL 23.7(H) 18.6(H) 19.1(H)  Hemoglobin 13.0 - 17.1 g/dL 13.6 12.6(L) 11.5(L)  Hematocrit 38.4 - 49.9 % 43.0 42.7 40.1  Platelets 140 - 400 10e3/uL 611(H) 656(H) 587(H)    CMP Latest Ref Rng 08/29/2014 08/24/2014 08/23/2014  Glucose 70 - 140 mg/dl 101 111(H) 154(H)  BUN 7.0 - 26.0 mg/dL 13._0 Creatinine 0.7 - 1.3 mg/dL 0.8 0.98 0.80  Sodium 136 - 145 mEq/L 137 138 133(L)  Potassium 3.5 - 5.1 mEq/L 4.1 4.2 3.4(L)  Chloride 96 - 112 mmol/L - 96 98  CO2 22 - 29 mEq/L 28 33(H) 31  Calcium 8.4 - 10.4 mg/dL 9.4 9.2 8.5  Total Protein 6.4 - 8.3 g/dL 6.7 - -  Total Bilirubin 0.20 - 1.20 mg/dL 0.77 - -  Alkaline Phos 40 - 150 U/L 78 - -  AST 5 - 34 U/L 14 - -  ALT 0 - 55 U/L 11 - -     RADIOGRAPHIC STUDIES: MRI abdomen with and without contrast 05/29/2012 IMPRESSION:  1. Structure within the right hepatic lobe has signal and enhancement characteristics compatible with benign hemangioma. 2. Gallstones. 3. Splenomegaly.    ASSESSMENT & PLAN:  74 year old gentleman with past medical history of diabetes, peripheral neuropathy, hypertension, coronary artery disease, recent bilateral foot wounds and cellulitis, presents with leukocytosis, thrombocytosis and anemia. He did have polycythemia in 2013 also.  1. Myeloproliferative neoplasm, JAK2 [id number](+) -He presented with leukocytosis, thrombocytosis and mild anemia. -I reviewed his laboratory findings. 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  -I recommended him to have a bone marrow biopsy to confirm and define the diagnosis. -The risk and benefit of bone marrow biopsy was discussed with patient and his son, he agrees. I'll arrange through interventional radiology -He is on Aleve daily for pain, no need additional aspirin.   2. iron deficient anemia  -His ferritin was 11, serum iron level was low at  20, saturation was 5%. This is consistent with iron deficient anemia  -He responded well to IV Feraheme, his hemoglobin has improved to 13.6 now.  - I'll consider IV iron as needed, due to his lack of response to oral iron supplements. - I'll refer him back to Dr. Ronalee Red for GI workup.   3. Bilateral foot wound  - he will continue follow-up with wound clinic   4. He will continue follow-up with his primary care physician and wound clinic for his diabetes,  hypertension, coronary artery disease and wound infection.  [facility]n -Bone marrow Biopsy but IR -Return to clinic in 3 weeks to review biopsy results.   All questions were answered. The patient knows to call the clinic with any problems, questions or concerns. I spent 40 minutes counseling the patient face to face. The total time spent in the appointment was 55 minutes and more than 50% was on counseling.     Truitt Merle, MD 10/04/2014 9:53 AM

## 2014-10-04 NOTE — Telephone Encounter (Signed)
Gave avs & calendar for April. Sent MD message to add referral.

## 2014-10-05 ENCOUNTER — Encounter: Payer: Self-pay | Admitting: Hematology

## 2014-10-08 ENCOUNTER — Telehealth: Payer: Self-pay | Admitting: Radiology

## 2014-10-08 ENCOUNTER — Telehealth: Payer: Self-pay | Admitting: *Deleted

## 2014-10-08 ENCOUNTER — Telehealth: Payer: Self-pay | Admitting: Hematology

## 2014-10-08 NOTE — Telephone Encounter (Signed)
Called patient regarding referral to Gastroenterology. Informed patient that [facility] will need previous GI notes before scheduling for history. Patient then stated he has not eaten all weekend and "has been on the edge of death feeling" he stated he was not worried about being dehydrated. I transferred to triage to see if we are able to help patient with not being able to eat to not cause dehydration. Patient also was not understanding what we had not contacted his PCP for the previous GI doctor and informed patient that he would have to be in contact with the previous GI for records to be sent.

## 2014-10-08 NOTE — Telephone Encounter (Signed)
Nurse from cancer center called. She states that pt has been calling them wanting them to call you for him ( i know, that's confusing). But nurse states that pt states he is "very sick". He can't get out of bed, he hasn't been able to eat, his sugars are uncontrolled. Pt wants to talk to you specifically.

## 2014-10-08 NOTE — Telephone Encounter (Signed)
Received transfer call from switchboard-pateinet calling  And stating that he is and has been very sick since Saturday. He states he is not able to eat, has ongoing nausea/vomiting. Denies fever. Denies diarrhea. States he cannot even get out of bed. Also states that his blood sugar levels are high, ranging from 164 on Friday 10/04/14 to 293 this morning.  Dr. Everlene Farrier is his PCP. Pt. Has not contacted his pcp himself. Patient states that is our job here at cancer center to call pcp.  Asked pt. If he was able to see his pcp at the office or Dr. Burr Medico here at the [facility]. He said no, he was sick and in the bed.  Asked pt. If he needed ambulance transport to local ED. Pt. Stated no he did not. He just wants Dr. Everlene Farrier called and to have Dr. Everlene Farrier call him at home.  Call made to Dr. Everlene Farrier and notified him of pt. situation and request.

## 2014-10-08 NOTE — Telephone Encounter (Signed)
I spoke with the patient has elevated sugars. He has been very depressed recently. The cancer center has let him know he does have some type of blood dyscrasia. His wife states one of his toes has become bluish in color and I suggested they called the wound center to address this. He is scheduled for a biopsy on Friday. I told him I would be happy to see him this weekend and would be available Saturday and Sunday between 8 and 3.

## 2014-10-10 ENCOUNTER — Other Ambulatory Visit: Payer: Self-pay | Admitting: Radiology

## 2014-10-10 ENCOUNTER — Telehealth: Payer: Self-pay | Admitting: *Deleted

## 2014-10-10 ENCOUNTER — Telehealth: Payer: Self-pay | Admitting: Hematology

## 2014-10-10 NOTE — Telephone Encounter (Signed)
Received a voicemail to cancel the ct bx for 4/17,this message was transferred to the desk nurse

## 2014-10-10 NOTE — Telephone Encounter (Signed)
Received message from wife re:  Appt for BM Bx needs to be cancelled due to pt is in the hospital.  Spoke with wife, and was informed that pt is in [facility].  Pt had surgery yesterday  10/09/14 on Right Big toe due to severe infection, and maybe losing the right foot later.  Pt will be on IV  antibiotics for 6 weeks. Wife wanted to know how urgent it is for pt to have BM Bx.  Wife was concerned if pt has cancer and no biopsy done yet, treatment can be delayed for pt.  Informed wife that pt will need to recover from  the infection first.  Wife understood that message will be relayed to Dr. Burr Medico, and she will be contacted with further instructions from md.   Pt's  Phone   [phone]

## 2014-10-11 ENCOUNTER — Ambulatory Visit (HOSPITAL_COMMUNITY): Admission: RE | Admit: 2014-10-11 | Payer: Medicare HMO | Source: Ambulatory Visit

## 2014-10-11 ENCOUNTER — Telehealth: Payer: Self-pay | Admitting: *Deleted

## 2014-10-11 NOTE — Telephone Encounter (Signed)
Called pt at home and spoke with wife.  Informed wife to call office when pt is discharged from the hospital.  Pt will be rescheduled  6-7 weeks after hospitalization for rescheduling BM Bx and office visit with Dr. Burr Medico.  Wife voiced understanding.

## 2014-10-15 ENCOUNTER — Ambulatory Visit: Payer: Medicare HMO | Admitting: Cardiovascular Disease

## 2014-10-25 ENCOUNTER — Ambulatory Visit (HOSPITAL_BASED_OUTPATIENT_CLINIC_OR_DEPARTMENT_OTHER): Payer: Medicare HMO | Admitting: Hematology

## 2014-10-25 ENCOUNTER — Encounter: Payer: Self-pay | Admitting: Hematology

## 2014-10-25 ENCOUNTER — Telehealth: Payer: Self-pay | Admitting: Hematology

## 2014-10-25 VITALS — BP 134/66 | HR 85 | Temp 98.4°F | Resp 18 | Ht 69.0 in | Wt 212.6 lb

## 2014-10-25 DIAGNOSIS — D509 Iron deficiency anemia, unspecified: Secondary | ICD-10-CM

## 2014-10-25 DIAGNOSIS — D471 Chronic myeloproliferative disease: Secondary | ICD-10-CM

## 2014-10-25 NOTE — Progress Notes (Signed)
[place]  Telephone:([phone] Fax:([phone]  Clinic New consult Note   Patient Care Team: Darlyne Russian, MD as PCP - General (Family Medicine) 10/25/2014  CHIEF COMPLAINTS:  Myeloproliferative neoplasm  Referring physician: Dr. Everlene Farrier  HISTORY OF PRESENTING ILLNESS:  [name] 74 y.o. male with medical history significant for DM, peripheral neuropathy and the recent lower extremity wounds, HTN, HLD, CAD, is being referred here to evaluate his abnormal CBC.  He developed bilateral lower extremity swollen, skin erythema, and ulcers on right calf in early January. He was evaluated by his primary care physician, and treated with a course of antibiotics for presumed cellulitis, but these symptoms did not improve significantly. He then developed multiple ulcers on the bottoms of his postoperative feet about 2-3 weeks ago,  without significant pain, no fever or chills, he was sent by his primary care physician from office to emergency room to ruled out osteomyelitis. MRI of the right foot did not suggest osteomyelitis. His white count was 19,000, hemoglobin 11.9, MCV 76.9, a platelet 630K. He was started on Vancomycin and had bedside debridement on 08/21/14. Blood cultures was negative. Wound cultures apparently growing multiple organisms. He was discharged home on 08/24/14 without oral antibiotics.   He followed with wound care on Monday, 08/26/2014, and  was restarted on doxycycline. His lower extremity swollen and skin erythema has significantly improved. The ulcer on the right calf has healed. The ulcers on the bottom of feet has significantly improved also.   Our medical records showed he had normal WBC and platelet count up to January 2015. Patient states he had on-and-off anemia for several years in the past, he was told he had iron deficient anemia and was taking iron pill on and off. He did however have elevated hemoglobin with the highest 18.3 with hematocrit  56.2 in November 2013.  INTERIM HISTORY [name] returns for follow-up. He comes in with his son in a wheelchair. 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 He stayed in the hospital for 8 days was discharged home with iv antibiotics ancef and a PICC line. The wound is healing,  he has a follow-up appointment with the surgeon and also infection disease doctor next Monday. He is unchanging stressing every day for him. He also has 2 small ulcers on the left foot which are healing. He denies any pain, no other new symptoms. He is able to walk around with a cane at home.    MEDICAL HISTORY:  Past Medical History  Diagnosis Date  . Allergy   . Arthritis   . Diabetes mellitus without complication   . Hypertension   . CHF (congestive heart failure)   . GERD (gastroesophageal reflux disease)   . Hyperlipidemia   . CAD (coronary artery disease)     s/p stent placement 2002    SURGICAL HISTORY: Past Surgical History  Procedure Laterality Date  . Joint replacement    . Spine surgery    . Heart stent  2002    SOCIAL HISTORY: History   Social History  . Marital Status: Married    Spouse Name: N/A  . Number of Children: N/A  . Years of Education: N/A   Occupational History  . Not on file.   Social History Main Topics  . Smoking status: Former Smoker -- 2.00 packs/day for 25 years    Types: Cigarettes    Quit date: 08/31/2001  . Smokeless tobacco:  Never Used  . Alcohol Use: No     Comment: occ  . Drug Use: No  . Sexual Activity: No   Other Topics Concern  . Not on file   Social History Narrative    FAMILY HISTORY: Family History  Problem Relation Age of Onset  . Cancer Mother     breast  . Diabetes Brother   . Hypertension Brother     ALLERGIES:  is allergic to penicillins.  MEDICATIONS:  Current Outpatient Prescriptions  Medication Sig Dispense Refill  . albuterol (PROVENTIL HFA;VENTOLIN HFA) 108 (90  BASE) MCG/ACT inhaler Inhale 2 puffs into the lungs every 4 (four) hours as needed for wheezing or shortness of breath (cough, shortness of breath or wheezing.). 1 Inhaler 3  . ceFAZolin (ANCEF) 2-3 GM-% SOLR Inject 2 g into the vein daily.    . Coenzyme Q10 (CO Q 10) 100 MG CAPS Take 400 mg by mouth daily.    . ferrous sulfate 325 (65 FE) MG tablet Take 1 tablet (325 mg total) by mouth 2 (two) times daily. 180 tablet 3  . furosemide (LASIX) 40 MG tablet Take two 40 mg tablets by mouth in the AM;  Take one 20m tablet by m outh in the PM 270 tablet 3  . glucose blood (ONE TOUCH ULTRA TEST) test strip Test 1-2 times daily as directed 100 each 0  . HYDROcodone-acetaminophen (NORCO) 10-325 MG per tablet     . HYDROcodone-acetaminophen (NORCO/VICODIN) 5-325 MG per tablet Take one tablet at night as needed for pain 30 tablet 0  . lisinopril (PRINIVIL,ZESTRIL) 20 MG tablet Take 1 tablet (20 mg total) by mouth daily. (Patient taking differently: Take 40 mg by mouth daily with breakfast. ) 90 tablet 3  . Melatonin 5 MG CAPS Take 5 mg by mouth at bedtime.     . metFORMIN (GLUCOPHAGE-XR) 500 MG 24 hr tablet Take 2 pills (10041m twice daily (Patient taking differently: Take 1,000 mg by mouth 2 (two) times daily. ) 360 tablet 3  . metoprolol (LOPRESSOR) 100 MG tablet Take 1 tablet (100 mg total) by mouth 2 (two) times daily. 180 tablet 3  . naproxen sodium (ANAPROX) 220 MG tablet Take 440 mg by mouth 2 (two) times daily with a meal.    . omeprazole (PRILOSEC) 20 MG capsule Take 1 capsule (20 mg total) by mouth daily. 90 capsule 3  . potassium chloride SA (K-DUR,KLOR-CON) 20 MEQ tablet Take 1 tablet (20 mEq total) by mouth 2 (two) times daily. 180 tablet 3  . rosuvastatin (CRESTOR) 20 MG tablet Take 1 tablet (20 mg total) by mouth daily. 90 tablet 3  . sitaGLIPtin (JANUVIA) 100 MG tablet Take 1 tablet (100 mg total) by mouth daily. 90 tablet 3  . triamcinolone cream (KENALOG) 0.1 % Apply topically 2 (two) times  daily. (Patient taking differently: Apply 1 application topically 2 (two) times daily as needed (for arthritis and itching). ) 480 g 0  . triamterene-hydrochlorothiazide (MAXZIDE-25) 37.5-25 MG per tablet Take 1 tablet by mouth daily.     No current facility-administered medications for this visit.    REVIEW OF SYSTEMS:   Constitutional: Denies fevers, chills or abnormal night sweats Eyes: Denies blurriness of vision, double vision or watery eyes Ears, nose, mouth, throat, and face: Denies mucositis or sore throat Respiratory: Denies cough, dyspnea or wheezes Cardiovascular: Denies palpitation, chest discomfort or lower extremity swelling Gastrointestinal:  Denies nausea, heartburn or change in bowel habits Skin: Denies abnormal skin rashes Lymphatics: Denies  new lymphadenopathy or easy bruising Neurological:Denies numbness, tingling or new weaknesses  Behavioral/Psych: Mood is stable, no new changes  All other systems were reviewed with the patient and are negative.  PHYSICAL EXAMINATION: ECOG PERFORMANCE STATUS: 1 - Symptomatic but completely ambulatory  Filed Vitals:   10/25/14 0925  BP: 134/66  Pulse: 85  Temp: 98.4 F (36.9 C)  Resp: 18   Filed Weights   10/25/14 0925  Weight: 212 lb 9.6 oz (96.435 kg)    GENERAL:alert, no distress and comfortable SKIN: skin color, texture, turgor are normal, no rashes or significant lesions EYES: normal, conjunctiva are pink and non-injected, sclera clear OROPHARYNX:no exudate, no erythema and lips, buccal mucosa, and tongue normal  NECK: supple, thyroid normal size, non-tender, without nodularity LYMPH:  no palpable lymphadenopathy in the cervical, axillary or inguinal LUNGS: clear to auscultation and percussion with normal breathing effort HEART: regular rate & rhythm and no murmurs and no lower extremity edema ABDOMEN:abdomen soft, non-tender and normal bowel sounds Musculoskeletal:no cyanosis of digits and no clubbing  PSYCH:  alert & oriented x 3 with fluent speech NEURO: no focal motor/sensory deficits Ext: Bilateral feet are wrapped with gauze  LABORATORY DATA:  I have reviewed the data as listed CBC Latest Ref Rng 10/04/2014 08/29/2014 08/24/2014  WBC 4.0 - 10.3 10e3/uL 23.7(H) 18.6(H) 19.1(H)  Hemoglobin 13.0 - 17.1 g/dL 13.6 12.6(L) 11.5(L)  Hematocrit 38.4 - 49.9 % 43.0 42.7 40.1  Platelets 140 - 400 10e3/uL 611(H) 656(H) 587(H)    CMP Latest Ref Rng 08/29/2014 08/24/2014 08/23/2014  Glucose 70 - 140 mg/dl 101 111(H) 154(H)  BUN 7.0 - 26.0 mg/dL 13._0 Creatinine 0.7 - 1.3 mg/dL 0.8 0.98 0.80  Sodium 136 - 145 mEq/L 137 138 133(L)  Potassium 3.5 - 5.1 mEq/L 4.1 4.2 3.4(L)  Chloride 96 - 112 mmol/L - 96 98  CO2 22 - 29 mEq/L 28 33(H) 31  Calcium 8.4 - 10.4 mg/dL 9.4 9.2 8.5  Total Protein 6.4 - 8.3 g/dL 6.7 - -  Total Bilirubin 0.20 - 1.20 mg/dL 0.77 - -  Alkaline Phos 40 - 150 U/L 78 - -  AST 5 - 34 U/L 14 - -  ALT 0 - 55 U/L 11 - -     RADIOGRAPHIC STUDIES: MRI abdomen with and without contrast 05/29/2012 IMPRESSION:  1. Structure within the right hepatic lobe has signal and enhancement characteristics compatible with benign hemangioma. 2. Gallstones. 3. Splenomegaly.    ASSESSMENT & PLAN:  74 year old gentleman with past medical history of diabetes, peripheral neuropathy, hypertension, coronary artery disease, recent bilateral foot wounds and cellulitis, presents with leukocytosis, thrombocytosis and anemia. He did have polycythemia in 2013 also.  1. Myeloproliferative neoplasm, JAK2 [id number](+) -He presented with leukocytosis, thrombocytosis and mild anemia. -I reviewed his laboratory findings. 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  -I recommended him to have a bone marrow biopsy to confirm and define the diagnosis. -Due to the recent right big toe amputation and osteomyelitis, I'll  postpone his bone marrow biopsy to 6 weeks later, when he finishes his IV antibiotics. -He is on Aleve daily for pain, no need additional aspirin. -I will likely start him on anagrelide to control his thrombocytosis after his bone marrow biopsy   2. iron deficient anemia  -His ferritin was 11, serum iron level was low at 20, saturation was 5%. This is consistent with iron deficient anemia  -He responded well to IV Feraheme,  his hemoglobin has improved to 13.6 afterwards  - I'll consider IV iron as needed, due to his lack of response to oral iron supplements. - I'll refer him back to Dr. Ronalee Red for GI workup, but pt declined    3. Bilateral foot wound, s/p R big toe amputation on 80/88/1103, complicated by osteomyelitis - he will continue follow-up with wound clinic, his surgeon and infection disease doctor -He'll continue IV Ancef per ID  4. He will continue follow-up with his primary care physician and wound clinic for his diabetes, hypertension, coronary artery disease and wound infection.  Paln -Return to clinic in 6 weeks to repeat CBC and a follow-up -We'll schedule his bone marrow biopsy on next visit -I'll obtain his medical records from [facility]  All questions were answered. The patient knows to call the clinic with any problems, questions or concerns. I spent 20 minutes counseling the patient face to face. The total time spent in the appointment was 25 minutes and more than 50% was on counseling.     Truitt Merle, MD 10/25/2014 9:53 AM

## 2014-10-25 NOTE — Progress Notes (Signed)
Faxed signed release of information to HIM at [facility] to obtain records from pt's recent hospitalization. [facility]   Fax    [phone]     Or        [phone].

## 2014-10-25 NOTE — Telephone Encounter (Signed)
Pt confirmed labs/ov per 04/29 POF, gave pt AVS and Calendar.... KJ

## 2014-11-12 ENCOUNTER — Other Ambulatory Visit: Payer: Self-pay | Admitting: Physician Assistant

## 2014-11-27 ENCOUNTER — Telehealth: Payer: Self-pay | Admitting: Oncology

## 2014-11-27 NOTE — Telephone Encounter (Signed)
Lft msg for pt confirming labs/ov r/s due to provider's schedule...  Mailed out schedule... KJ

## 2014-12-03 ENCOUNTER — Telehealth: Payer: Self-pay | Admitting: Hematology

## 2014-12-03 NOTE — Telephone Encounter (Signed)
pt cld wanting to r/s appt-gave pt r/s appt date & time-pt understood-pt req to mail updated copy of avs

## 2014-12-04 ENCOUNTER — Ambulatory Visit: Payer: Medicare HMO | Admitting: Hematology

## 2014-12-04 ENCOUNTER — Other Ambulatory Visit: Payer: Medicare HMO

## 2014-12-06 ENCOUNTER — Ambulatory Visit: Payer: Medicare HMO | Admitting: Hematology

## 2014-12-06 ENCOUNTER — Other Ambulatory Visit: Payer: Medicare HMO

## 2014-12-16 ENCOUNTER — Ambulatory Visit (HOSPITAL_BASED_OUTPATIENT_CLINIC_OR_DEPARTMENT_OTHER): Payer: Medicare HMO | Admitting: Hematology

## 2014-12-16 ENCOUNTER — Other Ambulatory Visit (HOSPITAL_BASED_OUTPATIENT_CLINIC_OR_DEPARTMENT_OTHER): Payer: Medicare HMO

## 2014-12-16 ENCOUNTER — Encounter: Payer: Self-pay | Admitting: Hematology

## 2014-12-16 ENCOUNTER — Telehealth: Payer: Self-pay | Admitting: Hematology

## 2014-12-16 VITALS — BP 130/53 | HR 84 | Temp 98.9°F | Resp 18 | Ht 69.0 in | Wt 215.8 lb

## 2014-12-16 DIAGNOSIS — M869 Osteomyelitis, unspecified: Secondary | ICD-10-CM

## 2014-12-16 DIAGNOSIS — D75839 Thrombocytosis, unspecified: Secondary | ICD-10-CM

## 2014-12-16 DIAGNOSIS — D471 Chronic myeloproliferative disease: Secondary | ICD-10-CM

## 2014-12-16 DIAGNOSIS — D509 Iron deficiency anemia, unspecified: Secondary | ICD-10-CM

## 2014-12-16 DIAGNOSIS — D649 Anemia, unspecified: Secondary | ICD-10-CM

## 2014-12-16 DIAGNOSIS — D473 Essential (hemorrhagic) thrombocythemia: Secondary | ICD-10-CM

## 2014-12-16 DIAGNOSIS — D72829 Elevated white blood cell count, unspecified: Secondary | ICD-10-CM

## 2014-12-16 LAB — CBC WITH DIFFERENTIAL/PLATELET
BASO%: 0.5 % (ref 0.0–2.0)
BASOS ABS: 0.1 10*3/uL (ref 0.0–0.1)
EOS%: 2.3 % (ref 0.0–7.0)
Eosinophils Absolute: 0.5 10*3/uL (ref 0.0–0.5)
HCT: 41.3 % (ref 38.4–49.9)
HEMOGLOBIN: 14 g/dL (ref 13.0–17.1)
LYMPH%: 2.3 % — ABNORMAL LOW (ref 14.0–49.0)
MCH: 28.3 pg (ref 27.2–33.4)
MCHC: 33.7 g/dL (ref 32.0–36.0)
MCV: 84 fL (ref 79.3–98.0)
MONO#: 0.7 10*3/uL (ref 0.1–0.9)
MONO%: 3.2 % (ref 0.0–14.0)
NEUT%: 91.7 % — ABNORMAL HIGH (ref 39.0–75.0)
NEUTROS ABS: 20.8 10*3/uL — AB (ref 1.5–6.5)
Platelets: 827 10*3/uL — ABNORMAL HIGH (ref 140–400)
RBC: 4.92 10*6/uL (ref 4.20–5.82)
RDW: 18.8 % — AB (ref 11.0–14.6)
WBC: 22.7 10*3/uL — ABNORMAL HIGH (ref 4.0–10.3)
lymph#: 0.5 10*3/uL — ABNORMAL LOW (ref 0.9–3.3)

## 2014-12-16 NOTE — Telephone Encounter (Signed)
Gave avs & calendar for July. Patient  Aware he will receive call for Biopsy.

## 2014-12-16 NOTE — Progress Notes (Signed)
[place]  Telephone:(336) [phone] Fax:(336) [place] consult Note   Patient Care Team: Darlyne Russian, MD as PCP - General (Family Medicine) 12/16/2014  CHIEF COMPLAINTS:  Myeloproliferative neoplasm  Referring physician: Dr. Everlene Farrier  HISTORY OF PRESENTING ILLNESS:  [name] 74 y.o. male with medical history significant for DM, peripheral neuropathy and the recent lower extremity wounds, HTN, HLD, CAD, is being referred here to evaluate his abnormal CBC.  He developed bilateral lower extremity swollen, skin erythema, and ulcers on right calf in early January. He was evaluated by his primary care physician, and treated with a course of antibiotics for presumed cellulitis, but these symptoms did not improve significantly. He then developed multiple ulcers on the bottoms of his postoperative feet about 2-3 weeks ago,  without significant pain, no fever or chills, he was sent by his primary care physician from office to emergency room to ruled out osteomyelitis. MRI of the right foot did not suggest osteomyelitis. His white count was 19,000, hemoglobin 11.9, MCV 76.9, a platelet 630K. He was started on Vancomycin and had bedside debridement on 08/21/14. Blood cultures was negative. Wound cultures apparently growing multiple organisms. He was discharged home on 08/24/14 without oral antibiotics.   He followed with wound care on Monday, 08/26/2014, and  was restarted on doxycycline. His lower extremity swollen and skin erythema has significantly improved. The ulcer on the right calf has healed. The ulcers on the bottom of feet has significantly improved also.   Our medical records showed he had normal WBC and platelet count up to January 2015. Patient states he had on-and-off anemia for several years in the past, he was told he had iron deficient anemia and was taking iron pill on and off. He did however have elevated hemoglobin with the highest 18.3 with hematocrit  56.2 in November 2013.  INTERIM HISTORY Mr. Viglione returns for follow-up. He comes in with his son in a wheelchair.  He has been follow-up with the wound clinic in [place], the right big toe wound is getting smaller, but has not healed completely yet. He also developed a large wound in the left side of his foot, with  possible infection, he is on antibiotic spectrum. He otherwise feels well, no other new complaints. He denies any leg swollen, dyspnea or fever.   MEDICAL HISTORY:  Past Medical History  Diagnosis Date  . Allergy   . Arthritis   . Diabetes mellitus without complication   . Hypertension   . CHF (congestive heart failure)   . GERD (gastroesophageal reflux disease)   . Hyperlipidemia   . CAD (coronary artery disease)     s/p stent placement 2002    SURGICAL HISTORY: Past Surgical History  Procedure Laterality Date  . Joint replacement    . Spine surgery    . Heart stent  2002    SOCIAL HISTORY: History   Social History  . Marital Status: Married    Spouse Name: N/A  . Number of Children: N/A  . Years of Education: N/A   Occupational History  . Not on file.   Social History Main Topics  . Smoking status: Former Smoker -- 2.00 packs/day for 25 years    Types: Cigarettes    Quit date: 08/31/2001  . Smokeless tobacco: Never Used  . Alcohol Use: No     Comment: occ  . Drug Use: No  . Sexual Activity: No   Other Topics Concern  . Not on file  Social History Narrative    FAMILY HISTORY: Family History  Problem Relation Age of Onset  . Cancer Mother     breast  . Diabetes Brother   . Hypertension Brother     ALLERGIES:  is allergic to penicillins.  MEDICATIONS:  Current Outpatient Prescriptions  Medication Sig Dispense Refill  . albuterol (PROVENTIL HFA;VENTOLIN HFA) 108 (90 BASE) MCG/ACT inhaler Inhale 2 puffs into the lungs every 4 (four) hours as needed for wheezing or shortness of breath (cough, shortness of breath or wheezing.). 1  Inhaler 3  . Coenzyme Q10 (CO Q 10) 100 MG CAPS Take 400 mg by mouth daily.    . ferrous sulfate 325 (65 FE) MG tablet Take 1 tablet (325 mg total) by mouth 2 (two) times daily. 180 tablet 3  . furosemide (LASIX) 40 MG tablet Take two 40 mg tablets by mouth in the AM;  Take one 40m tablet by m outh in the PM 270 tablet 3  . HYDROcodone-acetaminophen (NORCO/VICODIN) 5-325 MG per tablet Take one tablet at night as needed for pain 30 tablet 0  . lisinopril (PRINIVIL,ZESTRIL) 20 MG tablet Take 1 tablet (20 mg total) by mouth daily. (Patient taking differently: Take 40 mg by mouth daily with breakfast. ) 90 tablet 3  . Melatonin 5 MG CAPS Take 5 mg by mouth at bedtime.     . metFORMIN (GLUCOPHAGE-XR) 500 MG 24 hr tablet Take 2 pills (10078m twice daily (Patient taking differently: Take 1,000 mg by mouth 2 (two) times daily. ) 360 tablet 3  . metoprolol (LOPRESSOR) 100 MG tablet Take 1 tablet (100 mg total) by mouth 2 (two) times daily. 180 tablet 3  . naproxen sodium (ANAPROX) 220 MG tablet Take 440 mg by mouth 2 (two) times daily with a meal.    . omeprazole (PRILOSEC) 20 MG capsule Take 1 capsule (20 mg total) by mouth daily. 90 capsule 3  . ONE TOUCH ULTRA TEST test strip TEST 1-2 TIMES DAILY AS DIRECTED 100 each 2  . potassium chloride SA (K-DUR,KLOR-CON) 20 MEQ tablet Take 1 tablet (20 mEq total) by mouth 2 (two) times daily. 180 tablet 3  . rosuvastatin (CRESTOR) 20 MG tablet Take 1 tablet (20 mg total) by mouth daily. 90 tablet 3  . sitaGLIPtin (JANUVIA) 100 MG tablet Take 1 tablet (100 mg total) by mouth daily. 90 tablet 3  . sulfamethoxazole-trimethoprim (BACTRIM DS,SEPTRA DS) 800-160 MG per tablet Take by mouth.    . triamcinolone cream (KENALOG) 0.1 % Apply topically 2 (two) times daily. (Patient taking differently: Apply 1 application topically 2 (two) times daily as needed (for arthritis and itching). ) 480 g 0  . triamterene-hydrochlorothiazide (MAXZIDE-25) 37.5-25 MG per tablet Take 1  tablet by mouth daily.     No current facility-administered medications for this visit.    REVIEW OF SYSTEMS:   Constitutional: Denies fevers, chills or abnormal night sweats Eyes: Denies blurriness of vision, double vision or watery eyes Ears, nose, mouth, throat, and face: Denies mucositis or sore throat Respiratory: Denies cough, dyspnea or wheezes Cardiovascular: Denies palpitation, chest discomfort or lower extremity swelling Gastrointestinal:  Denies nausea, heartburn or change in bowel habits Skin: Denies abnormal skin rashes Lymphatics: Denies new lymphadenopathy or easy bruising Neurological:Denies numbness, tingling or new weaknesses  Behavioral/Psych: Mood is stable, no new changes  All other systems were reviewed with the patient and are negative.  PHYSICAL EXAMINATION: ECOG PERFORMANCE STATUS: 1 - Symptomatic but completely ambulatory  Filed Vitals:   12/16/14  0936  BP: 130/53  Pulse: 84  Temp: 98.9 F (37.2 C)  Resp: 18   Filed Weights   12/16/14 0933 12/16/14 0936  Weight: 215 lb 12.8 oz (97.886 kg) 215 lb 12.8 oz (97.886 kg)    GENERAL:alert, no distress and comfortable SKIN: skin color, texture, turgor are normal, no rashes or significant lesions EYES: normal, conjunctiva are pink and non-injected, sclera clear OROPHARYNX:no exudate, no erythema and lips, buccal mucosa, and tongue normal  NECK: supple, thyroid normal size, non-tender, without nodularity LYMPH:  no palpable lymphadenopathy in the cervical, axillary or inguinal LUNGS: clear to auscultation and percussion with normal breathing effort HEART: regular rate & rhythm and no murmurs and no lower extremity edema ABDOMEN:abdomen soft, non-tender and normal bowel sounds Musculoskeletal:no cyanosis of digits and no clubbing  PSYCH: alert & oriented x 3 with fluent speech NEURO: no focal motor/sensory deficits Ext: Bilateral feet are wrapped with gauze, his son showed me the picture of his ones.    LABORATORY DATA:  I have reviewed the data as listed CBC Latest Ref Rng 12/16/2014 10/04/2014 08/29/2014  WBC 4.0 - 10.3 10e3/uL 22.7(H) 23.7(H) 18.6(H)  Hemoglobin 13.0 - 17.1 g/dL 14.0 13.6 12.6(L)  Hematocrit 38.4 - 49.9 % 41.3 43.0 42.7  Platelets 140 - 400 10e3/uL 827(H) 611(H) 656(H)    CMP Latest Ref Rng 08/29/2014 08/24/2014 08/23/2014  Glucose 70 - 140 mg/dl 101 111(H) 154(H)  BUN 7.0 - 26.0 mg/dL 13.9 13 8   Creatinine 0.7 - 1.3 mg/dL 0.8 0.98 0.80  Sodium 136 - 145 mEq/L 137 138 133(L)  Potassium 3.5 - 5.1 mEq/L 4.1 4.2 3.4(L)  Chloride 96 - 112 mmol/L - 96 98  CO2 22 - 29 mEq/L 28 33(H) 31  Calcium 8.4 - 10.4 mg/dL 9.4 9.2 8.5  Total Protein 6.4 - 8.3 g/dL 6.7 - -  Total Bilirubin 0.20 - 1.20 mg/dL 0.77 - -  Alkaline Phos 40 - 150 U/L 78 - -  AST 5 - 34 U/L 14 - -  ALT 0 - 55 U/L 11 - -     RADIOGRAPHIC STUDIES: MRI abdomen with and without contrast 05/29/2012 IMPRESSION:  1. Structure within the right hepatic lobe has signal and enhancement characteristics compatible with benign hemangioma. 2. Gallstones. 3. Splenomegaly.    ASSESSMENT & PLAN:  74 year old gentleman with past medical history of diabetes, peripheral neuropathy, hypertension, coronary artery disease, recent bilateral foot wounds and cellulitis, presents with leukocytosis, thrombocytosis and anemia. He did have polycythemia in 2013 also.  1. Myeloproliferative neoplasm, JAK2 V617F(+) -He presented with leukocytosis, thrombocytosis and mild anemia. -I reviewed his laboratory findings. 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  -I recommended him to have a bone marrow biopsy to confirm and define the diagnosis, which was postponed because of his foot surgery and won't issue.  -He finally agreed to have bone marrow biopsy in the next few weeks, however arranged through IR at [facility]. -He is on Aleve  daily for pain, no need additional aspirin. -I will likely start him on anagrelide to control his thrombocytosis after his bone marrow biopsy. Hydrea would not be a good option due to his wound issue.   2. iron deficient anemia  -His ferritin was 11, serum iron level was low at 20, saturation was 5%. This is consistent with iron deficient anemia  -He responded well to IV Feraheme, his hemoglobin has normalized afterwards  - I'll consider IV iron as needed,  due to his lack of response to oral iron supplements. - I'll refer him back to Dr. Ronalee Red for GI workup, but pt declined    3. Bilateral foot wound, s/p R big toe amputation on 67/20/9470, complicated by osteomyelitis - he will continue follow-up with wound clinic, his surgeon and infection disease doctor  4. He will continue follow-up with his primary care physician and wound clinic for his diabetes, hypertension, coronary artery disease and wound infection.  Paln -We'll schedule his bone marrow biopsy  through IR in the next few weeks  -I'll see him back after his bone marrow biopsy, and finalize his treatment plan.   All questions were answered. The patient knows to call the clinic with any problems, questions or concerns. I spent 20 minutes counseling the patient face to face. The total time spent in the appointment was 25 minutes and more than 50% was on counseling.     Truitt Merle, MD 12/16/2014 9:46 AM

## 2014-12-22 ENCOUNTER — Other Ambulatory Visit: Payer: Self-pay | Admitting: Radiology

## 2014-12-23 ENCOUNTER — Other Ambulatory Visit: Payer: Self-pay

## 2014-12-23 ENCOUNTER — Other Ambulatory Visit: Payer: Self-pay | Admitting: Radiology

## 2014-12-24 ENCOUNTER — Ambulatory Visit (HOSPITAL_COMMUNITY)
Admission: RE | Admit: 2014-12-24 | Discharge: 2014-12-24 | Disposition: A | Discharge status: Home / Self Care | Payer: Medicare HMO | Source: Ambulatory Visit | Attending: Hematology | Admitting: Hematology

## 2014-12-24 ENCOUNTER — Encounter (HOSPITAL_COMMUNITY): Payer: Self-pay

## 2014-12-24 DIAGNOSIS — D471 Chronic myeloproliferative disease: Secondary | ICD-10-CM | POA: Diagnosis present

## 2014-12-24 LAB — PROTIME-INR
INR: 1.25 (ref 0.00–1.49)
Prothrombin Time: 15.8 seconds — ABNORMAL HIGH (ref 11.6–15.2)

## 2014-12-24 LAB — APTT: aPTT: 47 seconds — ABNORMAL HIGH (ref 24–37)

## 2014-12-24 LAB — CBC
HEMATOCRIT: 45 % (ref 39.0–52.0)
HEMOGLOBIN: 14.1 g/dL (ref 13.0–17.0)
MCH: 27.4 pg (ref 26.0–34.0)
MCHC: 31.3 g/dL (ref 30.0–36.0)
MCV: 87.5 fL (ref 78.0–100.0)
Platelets: 883 10*3/uL — ABNORMAL HIGH (ref 150–400)
RBC: 5.14 MIL/uL (ref 4.22–5.81)
RDW: 17.4 % — ABNORMAL HIGH (ref 11.5–15.5)
WBC: 21.1 10*3/uL — AB (ref 4.0–10.5)

## 2014-12-24 LAB — BONE MARROW EXAM

## 2014-12-24 LAB — GLUCOSE, CAPILLARY: GLUCOSE-CAPILLARY: 93 mg/dL (ref 65–99)

## 2014-12-24 MED ORDER — HYDROCODONE-ACETAMINOPHEN 5-325 MG PO TABS
1.0000 | ORAL_TABLET | ORAL | Status: DC | PRN
  Filled 2014-12-24: qty 2

## 2014-12-24 MED ORDER — MIDAZOLAM HCL 2 MG/2ML IJ SOLN
INTRAMUSCULAR | Status: AC
  Filled 2014-12-24: qty 6

## 2014-12-24 MED ORDER — FENTANYL CITRATE (PF) 100 MCG/2ML IJ SOLN
INTRAMUSCULAR | Status: AC | PRN
  Administered 2014-12-24 (×2): 50 ug via INTRAVENOUS

## 2014-12-24 MED ORDER — MIDAZOLAM HCL 2 MG/2ML IJ SOLN
INTRAMUSCULAR | Status: AC | PRN
  Administered 2014-12-24 (×3): 1 mg via INTRAVENOUS

## 2014-12-24 MED ORDER — SODIUM CHLORIDE 0.9 % IV SOLN
Freq: Once | INTRAVENOUS | Status: AC
  Administered 2014-12-24: 07:00:00 via INTRAVENOUS

## 2014-12-24 MED ORDER — FENTANYL CITRATE (PF) 100 MCG/2ML IJ SOLN
INTRAMUSCULAR | Status: AC
  Filled 2014-12-24: qty 4

## 2014-12-24 NOTE — Procedures (Signed)
CT guided bone marrow biopsy.  2 aspirates and 1 core.  Minimal blood loss and no immediate complication.   

## 2014-12-24 NOTE — Discharge Instructions (Signed)
Conscious Sedation Sedation is the use of medicines to promote relaxation and relieve discomfort and anxiety. Conscious sedation is a type of sedation. Under conscious sedation you are less alert than normal but are still able to respond to instructions or stimulation. Conscious sedation is used during short medical and dental procedures. It is milder than deep sedation or general anesthesia and allows you to return to your regular activities sooner.  LET [facility] CARE PROVIDER KNOW ABOUT:   Any allergies you have.  All medicines you are taking, including vitamins, herbs, eye drops, creams, and over-the-counter medicines.  Use of steroids (by mouth or creams).  Previous problems you or members of your family have had with the use of anesthetics.  Any blood disorders you have.  Previous surgeries you have had.  Medical conditions you have.  Possibility of pregnancy, if this applies.  Use of cigarettes, alcohol, or illegal drugs. RISKS AND COMPLICATIONS Generally, this is a safe procedure. However, as with any procedure, problems can occur. Possible problems include:  Oversedation.  Trouble breathing on your own. You may need to have a breathing tube until you are awake and breathing on your own.  Allergic reaction to any of the medicines used for the procedure. BEFORE THE PROCEDURE  You may have blood tests done. These tests can help show how well your kidneys and liver are working. They can also show how well your blood clots.  A physical exam will be done.  Only take medicines as directed by your health care provider. You may need to stop taking medicines (such as blood thinners, aspirin, or nonsteroidal anti-inflammatory drugs) before the procedure.   Do not eat or drink at least 6 hours before the procedure or as directed by your health care provider.  Arrange for a responsible adult, family member, or friend to take you home after the procedure. He or she should stay  with you for at least 24 hours after the procedure, until the medicine has worn off. PROCEDURE   An intravenous (IV) catheter will be inserted into one of your veins. Medicine will be able to flow directly into your body through this catheter. You may be given medicine through this tube to help prevent pain and help you relax.  The medical or dental procedure will be done. AFTER THE PROCEDURE  You will stay in a recovery area until the medicine has worn off. Your blood pressure and pulse will be checked.   Depending on the procedure you had, you may be allowed to go home when you can tolerate liquids and your pain is under control. Document Released: 03/09/2001 Document Revised: 06/19/2013 Document Reviewed: 02/19/2013 [facility] Patient Information 2015 [place], [place]. This information is not intended to replace advice given to you by your health care provider. Make sure you discuss any questions you have with your health care provider. Bone Marrow Aspiration, Bone Marrow Biopsy Care After Read the instructions outlined below and refer to this sheet in the next few weeks. These discharge instructions provide you with general information on caring for yourself after you leave the hospital. Your caregiver may also give you specific instructions. While your treatment has been planned according to the most current medical practices available, unavoidable complications occasionally occur. If you have any problems or questions after discharge, call your caregiver. FINDING OUT THE RESULTS OF YOUR TEST Not all test results are available during your visit. If your test results are not back during the visit, make an appointment with your  your caregiver to find out the results. Do not assume everything is normal if you have not heard from your caregiver or the medical facility. It is important for you to follow up on all of your test results.  °HOME CARE INSTRUCTIONS  °You have had sedation and may be sleepy or  dizzy. Your thinking may not be as clear as usual. For the next 24 hours: °· Only take over-the-counter or prescription medicines for pain, discomfort, and or fever as directed by your caregiver. °· Do not drink alcohol. °· Do not smoke. °· Do not drive. °· Do not make important legal decisions. °· Do not operate heavy machinery. °· Do not care for small children by yourself. °· Keep your dressing clean and dry. You may replace dressing with a bandage after 24 hours. °· You may take a bath or shower after 24 hours. °· Use an ice pack for 20 minutes every 2 hours while awake for pain as needed. °SEEK MEDICAL CARE IF:  °· There is redness, swelling, or increasing pain at the biopsy site. °· There is pus coming from the biopsy site. °· There is drainage from a biopsy site lasting longer than one day. °· An unexplained oral temperature above 102° F (38.9° C) develops. °SEEK IMMEDIATE MEDICAL CARE IF:  °· You develop a rash. °· You have difficulty breathing. °· You develop any reaction or side effects to medications given. °Document Released: 01/01/2005 Document Revised: 09/06/2011 Document Reviewed: 06/11/2008 °Exit[employer]® Patient Information ©2015 [employer]. This information is not intended to replace advice given to you by your health care provider. Make sure you discuss any questions you have with your health care provider. ° °

## 2014-12-24 NOTE — H&P (Signed)
Chief Complaint: "I'm having a biopsy"  Referring Physician(s): Feng,Yan  History of Present Illness: [name] is a 74 y.o. male with past medical history significant for diabetes, peripheral neuropathy, hypertension, coronary artery disease, recent bilateral foot wounds and cellulitis, as well as leukocytosis, thrombocytosis and mild anemia/ JAK2 mutation.  He presents today for CT-guided bone marrow biopsy for further evaluation.  Past Medical History  Diagnosis Date  . Allergy   . Arthritis   . Diabetes mellitus without complication   . Hypertension   . CHF (congestive heart failure)   . GERD (gastroesophageal reflux disease)   . Hyperlipidemia   . CAD (coronary artery disease)     s/p stent placement 2002    Past Surgical History  Procedure Laterality Date  . Joint replacement    . Spine surgery    . Heart stent  2002    Allergies: Penicillins  Medications: Prior to Admission medications   Medication Sig Start Date End Date Taking? Authorizing Provider  Coenzyme Q10 (CO Q 10) 100 MG CAPS Take 400 mg by mouth daily.   Yes Historical Provider, MD  ferrous sulfate 325 (65 FE) MG tablet Take 1 tablet (325 mg total) by mouth 2 (two) times daily. 07/07/14  Yes Darlyne Russian, MD  furosemide (LASIX) 40 MG tablet Take two 40 mg tablets by mouth in the AM;  Take one 2m tablet by m outh in the PM 07/07/14  Yes SDarlyne Russian MD  lisinopril (PRINIVIL,ZESTRIL) 20 MG tablet Take 1 tablet (20 mg total) by mouth daily. Patient taking differently: Take 40 mg by mouth daily with breakfast.  07/31/14  Yes PThayer Headings MD  Melatonin 5 MG CAPS Take 5 mg by mouth at bedtime.    Yes Historical Provider, MD  metFORMIN (GLUCOPHAGE-XR) 500 MG 24 hr tablet Take 2 pills (10063m twice daily Patient taking differently: Take 1,000 mg by mouth 2 (two) times daily.  07/09/14  Yes StDarlyne RussianMD  metoprolol (LOPRESSOR) 100 MG tablet Take 1 tablet (100 mg total) by mouth 2 (two) times  daily. 07/09/14  Yes StDarlyne RussianMD  naproxen sodium (ANAPROX) 220 MG tablet Take 440 mg by mouth 2 (two) times daily with a meal.   Yes Historical Provider, MD  omeprazole (PRILOSEC) 20 MG capsule Take 1 capsule (20 mg total) by mouth daily. 07/07/14  Yes StDarlyne RussianMD  ONE TOUCH ULTRA TEST test strip TEST 1-2 TIMES DAILY AS DIRECTED 11/12/14  Yes StDarlyne RussianMD  potassium chloride SA (K-DUR,KLOR-CON) 20 MEQ tablet Take 1 tablet (20 mEq total) by mouth 2 (two) times daily. 07/07/14  Yes StDarlyne RussianMD  rosuvastatin (CRESTOR) 20 MG tablet Take 1 tablet (20 mg total) by mouth daily. 07/07/14  Yes StDarlyne RussianMD  sitaGLIPtin (JANUVIA) 100 MG tablet Take 1 tablet (100 mg total) by mouth daily. 07/09/14  Yes StDarlyne RussianMD  triamcinolone cream (KENALOG) 0.1 % Apply topically 2 (two) times daily. Patient taking differently: Apply 1 application topically 2 (two) times daily as needed (for arthritis and itching).  07/07/14  Yes StDarlyne RussianMD  triamterene-hydrochlorothiazide (MAXZIDE-25) 37.5-25 MG per tablet Take 1 tablet by mouth daily.   Yes Historical Provider, MD  albuterol (PROVENTIL HFA;VENTOLIN HFA) 108 (90 BASE) MCG/ACT inhaler Inhale 2 puffs into the lungs every 4 (four) hours as needed for wheezing or shortness of breath (cough, shortness of breath or wheezing.). 08/22/14  Darlyne Russian, MD  HYDROcodone-acetaminophen (NORCO/VICODIN) 5-325 MG per tablet Take one tablet at night as needed for pain 07/24/14   Darlyne Russian, MD     Family History  Problem Relation Age of Onset  . Cancer Mother     breast  . Diabetes Brother   . Hypertension Brother     History   Social History  . Marital Status: Married    Spouse Name: N/A  . Number of Children: N/A  . Years of Education: N/A   Social History Main Topics  . Smoking status: Former Smoker -- 2.00 packs/day for 25 years    Types: Cigarettes    Quit date: 08/31/2001  . Smokeless tobacco: Never Used  . Alcohol Use: No      Comment: occ  . Drug Use: No  . Sexual Activity: No   Other Topics Concern  . None   Social History Narrative      Review of Systems  Constitutional: Negative for fever and chills.  Respiratory: Negative for cough and shortness of breath.   Cardiovascular: Negative for chest pain.  Gastrointestinal: Negative for nausea, vomiting, abdominal pain and blood in stool.  Genitourinary: Negative for dysuria and hematuria.  Musculoskeletal: Positive for back pain.       Bil foot pain  Neurological: Negative for headaches.    Vital Signs: BP 149/74 mmHg  Pulse 80  Temp(Src) 98.1 F (36.7 C) (Oral)  Resp 20  Ht 5' 9"  (1.753 m)  Wt 215 lb 12.8 oz (97.886 kg)  BMI 31.85 kg/m2  SpO2 98%  Physical Exam  Constitutional: He is oriented to person, place, and time. He appears well-developed and well-nourished.  Cardiovascular: Normal rate and regular rhythm.   Pulmonary/Chest: Effort normal and breath sounds normal.  Abdominal: Soft. Bowel sounds are normal. There is no tenderness.  Musculoskeletal: He exhibits no edema.  Both feet in protective boots  Neurological: He is alert and oriented to person, place, and time.    Mallampati Score:     Imaging: No results found.  Labs:  CBC:  Recent Labs  08/29/14 1520 10/04/14 0923 12/16/14 0835 12/24/14 0700  WBC 18.6* 23.7* 22.7* 21.1*  HGB 12.6* 13.6 14.0 14.1  HCT 42.7 43.0 41.3 45.0  PLT 656* 611* 827* 883*    COAGS:  Recent Labs  12/24/14 0700  INR 1.25  APTT 47*    BMP:  Recent Labs  08/20/14 1300 08/22/14 1212 08/23/14 0500 08/24/14 0500 08/29/14 1523  NA 137 137 133* 138 137  K 4.0 3.9 3.4* 4.2 4.1  CL 96 100 98 96  --   CO2 28 26 31  33* 28  GLUCOSE 149* 124* 154* 111* 101  BUN 15 7 8 13  13.9  CALCIUM 9.3 9.3 8.5 9.2 9.4  CREATININE 0.76 0.70 0.80 0.98 0.8  GFRNONAA 90* >90 88* 81*  --   GFRAA >90 >90 >90 >90  --     LIVER FUNCTION TESTS:  Recent Labs  07/07/14 0844 08/20/14 1300  08/29/14 1523  BILITOT 1.1 1.0 0.77  AST 12 25 14   ALT 9 9 11   ALKPHOS 83 88 78  PROT 6.8 7.1 6.7  ALBUMIN 4.2 3.9 3.9    TUMOR MARKERS: No results for input(s): AFPTM, CEA, CA199, CHROMGRNA in the last 8760 hours.  Assessment and Plan: [name] is a 74 y.o. male with past medical history significant for diabetes, peripheral neuropathy, hypertension, coronary artery disease, recent bilateral foot wounds and cellulitis, as  well as leukocytosis, thrombocytosis and mild anemia/ JAK2 mutation.  He presents today for CT-guided bone marrow biopsy for further evaluation.Risks and benefits discussed with the patient including, but not limited to bleeding, infection, damage to adjacent structures or low yield requiring additional tests.All of the patient's questions were answered, patient is agreeable to proceed.Consent signed and in chart.       Signed: D. Rowe Robert 12/24/2014, 8:16 AM   I spent a total of 20 minutes  in face to face in clinical consultation, greater than 50% of which was counseling/coordinating care for CT guided bone marrow biopsy

## 2014-12-26 ENCOUNTER — Encounter: Payer: Self-pay | Admitting: Hematology

## 2014-12-26 ENCOUNTER — Ambulatory Visit (HOSPITAL_BASED_OUTPATIENT_CLINIC_OR_DEPARTMENT_OTHER): Payer: Medicare HMO | Admitting: Hematology

## 2014-12-26 ENCOUNTER — Telehealth: Payer: Self-pay | Admitting: Hematology

## 2014-12-26 VITALS — BP 172/94 | HR 97 | Temp 97.8°F | Resp 18 | Ht 69.0 in

## 2014-12-26 DIAGNOSIS — E119 Type 2 diabetes mellitus without complications: Secondary | ICD-10-CM

## 2014-12-26 DIAGNOSIS — D499 Neoplasm of unspecified behavior of unspecified site: Secondary | ICD-10-CM | POA: Diagnosis not present

## 2014-12-26 DIAGNOSIS — D509 Iron deficiency anemia, unspecified: Secondary | ICD-10-CM

## 2014-12-26 DIAGNOSIS — D471 Chronic myeloproliferative disease: Secondary | ICD-10-CM

## 2014-12-26 MED ORDER — ANAGRELIDE HCL 0.5 MG PO CAPS: 0.5000 mg | ORAL_CAPSULE | Freq: Two times a day (BID) | ORAL | Status: DC

## 2014-12-26 NOTE — Telephone Encounter (Signed)
Gave and printed appt sched and avs for pt for July  °

## 2014-12-26 NOTE — Progress Notes (Signed)
[place]  Telephone:([phone]) [phone] Fax:([phone]) [place] consult Note   Patient Care Team: Darlyne Russian, MD as PCP - General (Family Medicine) Rejeana Brock. Vanessa Markesan, MD as Attending Physician (Infectious Diseases) 12/26/2014  CHIEF COMPLAINTS:  Myeloproliferative neoplasm  Referring physician: Dr. Everlene Farrier  HISTORY OF PRESENTING ILLNESS:  [name] 74 y.o. male with medical history significant for DM, peripheral neuropathy and the recent lower extremity wounds, HTN, HLD, CAD, is being referred here to evaluate his abnormal CBC.  He developed bilateral lower extremity swollen, skin erythema, and ulcers on right calf in early January. He was evaluated by his primary care physician, and treated with a course of antibiotics for presumed cellulitis, but these symptoms did not improve significantly. He then developed multiple ulcers on the bottoms of his postoperative feet about 2-3 weeks ago,  without significant pain, no fever or chills, he was sent by his primary care physician from office to emergency room to ruled out osteomyelitis. MRI of the right foot did not suggest osteomyelitis. His white count was 19,000, hemoglobin 11.9, MCV 76.9, a platelet 630K. He was started on Vancomycin and had bedside debridement on 08/21/14. Blood cultures was negative. Wound cultures apparently growing multiple organisms. He was discharged home on 08/24/14 without oral antibiotics.   He followed with wound care on Monday, 08/26/2014, and  was restarted on doxycycline. His lower extremity swollen and skin erythema has significantly improved. The ulcer on the right calf has healed. The ulcers on the bottom of feet has significantly improved also.   Our medical records showed he had normal WBC and platelet count up to January 2015. Patient states he had on-and-off anemia for several years in the past, he was told he had iron deficient anemia and was taking iron pill on and off. He did  however have elevated hemoglobin with the highest 18.3 with hematocrit 56.2 in November 2013.  INTERIM HISTORY Mr. [name] returns for follow-up and discuss his bone marrow. He tolerated the procedure very well, no significant pain or other complaints. He is scheduled to have a tendon surgery on July 5, to in previous morbidity of feet. His son showed me the picture of his wound which were taking from yesterday, its continue healing, no signs of infection.  MEDICAL HISTORY:  Past Medical History  Diagnosis Date  . Allergy   . Arthritis   . Diabetes mellitus without complication   . Hypertension   . CHF (congestive heart failure)   . GERD (gastroesophageal reflux disease)   . Hyperlipidemia   . CAD (coronary artery disease)     s/p stent placement 2002    SURGICAL HISTORY: Past Surgical History  Procedure Laterality Date  . Joint replacement    . Spine surgery    . Heart stent  2002    SOCIAL HISTORY: History   Social History  . Marital Status: Married    Spouse Name: N/A  . Number of Children: N/A  . Years of Education: N/A   Occupational History  . Not on file.   Social History Main Topics  . Smoking status: Former Smoker -- 2.00 packs/day for 25 years    Types: Cigarettes    Quit date: 08/31/2001  . Smokeless tobacco: Never Used  . Alcohol Use: No     Comment: occ  . Drug Use: No  . Sexual Activity: No   Other Topics Concern  . Not on file   Social History Narrative    FAMILY  HISTORY: Family History  Problem Relation Age of Onset  . Cancer Mother     breast  . Diabetes Brother   . Hypertension Brother     ALLERGIES:  is allergic to penicillins.  MEDICATIONS:  Current Outpatient Prescriptions  Medication Sig Dispense Refill  . Coenzyme Q10 (CO Q 10) 100 MG CAPS Take 400 mg by mouth daily.    . ferrous sulfate 325 (65 FE) MG tablet Take 1 tablet (325 mg total) by mouth 2 (two) times daily. 180 tablet 3  . furosemide (LASIX) 40 MG tablet Take two  40 mg tablets by mouth in the AM;  Take one 25m tablet by m outh in the PM 270 tablet 3  . lisinopril (PRINIVIL,ZESTRIL) 20 MG tablet Take 1 tablet (20 mg total) by mouth daily. (Patient taking differently: Take 40 mg by mouth daily with breakfast. ) 90 tablet 3  . Melatonin 5 MG CAPS Take 5 mg by mouth at bedtime.     . metFORMIN (GLUCOPHAGE-XR) 500 MG 24 hr tablet Take 2 pills (10073m twice daily (Patient taking differently: Take 1,000 mg by mouth 2 (two) times daily. ) 360 tablet 3  . metoprolol (LOPRESSOR) 100 MG tablet Take 1 tablet (100 mg total) by mouth 2 (two) times daily. 180 tablet 3  . naproxen sodium (ANAPROX) 220 MG tablet Take 440 mg by mouth 2 (two) times daily with a meal.    . omeprazole (PRILOSEC) 20 MG capsule Take 1 capsule (20 mg total) by mouth daily. 90 capsule 3  . ONE TOUCH ULTRA TEST test strip TEST 1-2 TIMES DAILY AS DIRECTED 100 each 2  . potassium chloride SA (K-DUR,KLOR-CON) 20 MEQ tablet Take 1 tablet (20 mEq total) by mouth 2 (two) times daily. 180 tablet 3  . rosuvastatin (CRESTOR) 20 MG tablet Take 1 tablet (20 mg total) by mouth daily. 90 tablet 3  . sitaGLIPtin (JANUVIA) 100 MG tablet Take 1 tablet (100 mg total) by mouth daily. 90 tablet 3  . triamcinolone cream (KENALOG) 0.1 % Apply topically 2 (two) times daily. (Patient taking differently: Apply 1 application topically 2 (two) times daily as needed (for arthritis and itching). ) 480 g 0  . triamterene-hydrochlorothiazide (MAXZIDE-25) 37.5-25 MG per tablet Take 1 tablet by mouth daily.    . Marland Kitchenlbuterol (PROVENTIL HFA;VENTOLIN HFA) 108 (90 BASE) MCG/ACT inhaler Inhale 2 puffs into the lungs every 4 (four) hours as needed for wheezing or shortness of breath (cough, shortness of breath or wheezing.). (Patient not taking: Reported on 12/26/2014) 1 Inhaler 3  . anagrelide (AGRYLIN) 0.5 MG capsule Take 1 capsule (0.5 mg total) by mouth 2 (two) times daily. 60 capsule 0  . HYDROcodone-acetaminophen (NORCO/VICODIN) 5-325  MG per tablet Take one tablet at night as needed for pain (Patient not taking: Reported on 12/26/2014) 30 tablet 0   No current facility-administered medications for this visit.    REVIEW OF SYSTEMS:   Constitutional: Denies fevers, chills or abnormal night sweats Eyes: Denies blurriness of vision, double vision or watery eyes Ears, nose, mouth, throat, and face: Denies mucositis or sore throat Respiratory: Denies cough, dyspnea or wheezes Cardiovascular: Denies palpitation, chest discomfort or lower extremity swelling Gastrointestinal:  Denies nausea, heartburn or change in bowel habits Skin: Denies abnormal skin rashes Lymphatics: Denies new lymphadenopathy or easy bruising Neurological:Denies numbness, tingling or new weaknesses  Behavioral/Psych: Mood is stable, no new changes  All other systems were reviewed with the patient and are negative.  PHYSICAL EXAMINATION: ECOG PERFORMANCE STATUS:  3  Filed Vitals:   12/26/14 0838  BP: 172/94  Pulse: 97  Temp: 97.8 F (36.6 C)  Resp: 18   Filed Weights    GENERAL:alert, no distress and comfortable SKIN: skin color, texture, turgor are normal, no rashes or significant lesions EYES: normal, conjunctiva are pink and non-injected, sclera clear OROPHARYNX:no exudate, no erythema and lips, buccal mucosa, and tongue normal  NECK: supple, thyroid normal size, non-tender, without nodularity LYMPH:  no palpable lymphadenopathy in the cervical, axillary or inguinal LUNGS: clear to auscultation and percussion with normal breathing effort HEART: regular rate & rhythm and no murmurs and no lower extremity edema ABDOMEN:abdomen soft, non-tender and normal bowel sounds Musculoskeletal:no cyanosis of digits and no clubbing  PSYCH: alert & oriented x 3 with fluent speech NEURO: no focal motor/sensory deficits Ext: Bilateral feet are wrapped with gauze, his son showed me the picture of his wounds   LABORATORY DATA:  I have reviewed the data as  listed CBC Latest Ref Rng 12/24/2014 12/16/2014 10/04/2014  WBC 4.0 - 10.5 K/uL 21.1(H) 22.7(H) 23.7(H)  Hemoglobin 13.0 - 17.0 g/dL 14.1 14.0 13.6  Hematocrit 39.0 - 52.0 % 45.0 41.3 43.0  Platelets 150 - 400 K/uL 883(H) 827(H) 611(H)    CMP Latest Ref Rng 08/29/2014 08/24/2014 08/23/2014  Glucose 70 - 140 mg/dl 101 111(H) 154(H)  BUN 7.0 - 26.0 mg/dL 13.9 13 8   Creatinine 0.7 - 1.3 mg/dL 0.8 0.98 0.80  Sodium 136 - 145 mEq/L 137 138 133(L)  Potassium 3.5 - 5.1 mEq/L 4.1 4.2 3.4(L)  Chloride 96 - 112 mmol/L - 96 98  CO2 22 - 29 mEq/L 28 33(H) 31  Calcium 8.4 - 10.4 mg/dL 9.4 9.2 8.5  Total Protein 6.4 - 8.3 g/dL 6.7 - -  Total Bilirubin 0.20 - 1.20 mg/dL 0.77 - -  Alkaline Phos 40 - 150 U/L 78 - -  AST 5 - 34 U/L 14 - -  ALT 0 - 55 U/L 11 - -     RADIOGRAPHIC STUDIES: MRI abdomen with and without contrast 05/29/2012 IMPRESSION:  1. Structure within the right hepatic lobe has signal and enhancement characteristics compatible with benign hemangioma. 2. Gallstones. 3. Splenomegaly.    ASSESSMENT & PLAN:  74 year old gentleman with past medical history of diabetes, peripheral neuropathy, hypertension, coronary artery disease, recent bilateral foot wounds and cellulitis, presents with leukocytosis, thrombocytosis and anemia. He did have polycythemia in 2013 also.  1. Myeloproliferative neoplasm, JAK2 [id number](+) -He presented with leukocytosis, thrombocytosis and mild anemia. -I reviewed his laboratory findings. 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  -His bone marrow biopsy was done 2 days ago, formal report is not signed out. I discussed with Dr. Gari Crown this morning, the morphology is consistent with myeloproliferative new present, favor ET, myelofibrosis will be ruled out by further staining. -I recommend anagrelide 0.5 mg twice daily to control his thrombocytosis, and a decrease the  risk of thrombosis. Hydrea would not be a good option due to his wound issue.   2. iron deficient anemia  -His ferritin was 11, serum iron level was low at 20, saturation was 5%. This is consistent with iron deficient anemia  -He responded well to IV Feraheme, his hemoglobin has normalized afterwards  - I'll refer him back to Dr. Ronalee Red for GI workup, but pt declined    3. Bilateral foot wound, s/p R big toe amputation on 54/00/8676, complicated by osteomyelitis - he will continue follow-up  with wound clinic, his surgeon and infection disease doctor  4. He will continue follow-up with his primary care physician and wound clinic for his diabetes, hypertension, coronary artery disease and wound infection.  Paln -Start anagrelide 0.5 mg twice daily -Return to clinic in 3 weeks with CBC  All questions were answered. The patient knows to call the clinic with any problems, questions or concerns. I spent 20 minutes counseling the patient face to face. The total time spent in the appointment was 25 minutes and more than 50% was on counseling.     Truitt Merle, MD 12/26/2014 9:12 AM

## 2015-01-06 LAB — CHROMOSOME ANALYSIS, BONE MARROW

## 2015-01-10 ENCOUNTER — Ambulatory Visit: Payer: Medicare HMO | Admitting: Hematology

## 2015-01-14 ENCOUNTER — Telehealth: Payer: Self-pay

## 2015-01-14 NOTE — Telephone Encounter (Signed)
Let him know OK to continue anagrelide until I see him  [name] 01/14/2015

## 2015-01-14 NOTE — Telephone Encounter (Signed)
Pt called stating he is taking anagrelide. The information sheet says to call right away for certain sx. He has a palm size or bigger bruise on the back of his knee that appeared in the last 24 to 30 hours. His appt is on Thursday at [facility].

## 2015-01-14 NOTE — Telephone Encounter (Signed)
They discovered bruise behind his knee last night and was not there to day before. His son saw it during feet dressing changes. There is no swelling, no tenderness, no heat. He has taken his morning dose of anagrelide. Does he need to stop his anagrelide? Does he need to be seen before Thursday?

## 2015-01-14 NOTE — Telephone Encounter (Signed)
Pt stated in prior phone conversation to call him only if there is any change in instructions he will continue anagrelide unless we tell him not to.

## 2015-01-16 ENCOUNTER — Encounter: Payer: Self-pay | Admitting: Hematology

## 2015-01-16 ENCOUNTER — Ambulatory Visit (HOSPITAL_BASED_OUTPATIENT_CLINIC_OR_DEPARTMENT_OTHER): Payer: Medicare HMO

## 2015-01-16 ENCOUNTER — Telehealth: Payer: Self-pay | Admitting: Hematology

## 2015-01-16 ENCOUNTER — Ambulatory Visit (HOSPITAL_BASED_OUTPATIENT_CLINIC_OR_DEPARTMENT_OTHER): Payer: Medicare HMO | Admitting: Hematology

## 2015-01-16 VITALS — BP 177/73 | HR 85 | Temp 98.6°F | Resp 19 | Ht 69.0 in | Wt 219.7 lb

## 2015-01-16 DIAGNOSIS — D75839 Thrombocytosis, unspecified: Secondary | ICD-10-CM

## 2015-01-16 DIAGNOSIS — D649 Anemia, unspecified: Secondary | ICD-10-CM

## 2015-01-16 DIAGNOSIS — D473 Essential (hemorrhagic) thrombocythemia: Secondary | ICD-10-CM

## 2015-01-16 DIAGNOSIS — D72829 Elevated white blood cell count, unspecified: Secondary | ICD-10-CM

## 2015-01-16 LAB — CBC WITH DIFFERENTIAL/PLATELET
BASO%: 0.5 % (ref 0.0–2.0)
Basophils Absolute: 0.1 10*3/uL (ref 0.0–0.1)
EOS%: 3 % (ref 0.0–7.0)
Eosinophils Absolute: 0.7 10*3/uL — ABNORMAL HIGH (ref 0.0–0.5)
HEMATOCRIT: 39.3 % (ref 38.4–49.9)
HEMOGLOBIN: 12.8 g/dL — AB (ref 13.0–17.1)
LYMPH%: 3.3 % — AB (ref 14.0–49.0)
MCH: 27 pg — ABNORMAL LOW (ref 27.2–33.4)
MCHC: 32.6 g/dL (ref 32.0–36.0)
MCV: 82.8 fL (ref 79.3–98.0)
MONO#: 0.6 10*3/uL (ref 0.1–0.9)
MONO%: 2.9 % (ref 0.0–14.0)
NEUT%: 90.3 % — ABNORMAL HIGH (ref 39.0–75.0)
NEUTROS ABS: 19.7 10*3/uL — AB (ref 1.5–6.5)
Platelets: 676 10*3/uL — ABNORMAL HIGH (ref 140–400)
RBC: 4.74 10*6/uL (ref 4.20–5.82)
RDW: 18.7 % — AB (ref 11.0–14.6)
WBC: 21.8 10*3/uL — AB (ref 4.0–10.3)
lymph#: 0.7 10*3/uL — ABNORMAL LOW (ref 0.9–3.3)

## 2015-01-16 NOTE — Telephone Encounter (Signed)
Patient sent back to lab and given avs report and appointments for September  °

## 2015-01-16 NOTE — Progress Notes (Signed)
[place]  Telephone:(336) [phone] Fax:(336) [place] consult Note   Patient Care Team: Darlyne Russian, MD as PCP - General (Family Medicine) Rejeana Brock. Vanessa [place], MD as Attending Physician (Infectious Diseases) 01/16/2015  CHIEF COMPLAINTS:  Follow up ET   Referring physician: Dr. Everlene Farrier  HISTORY OF PRESENTING ILLNESS:  [name] 74 y.o. male with medical history significant for DM, peripheral neuropathy and the recent lower extremity wounds, HTN, HLD, CAD, is being referred here to evaluate his abnormal CBC.  He developed bilateral lower extremity swollen, skin erythema, and ulcers on right calf in early January. He was evaluated by his primary care physician, and treated with a course of antibiotics for presumed cellulitis, but these symptoms did not improve significantly. He then developed multiple ulcers on the bottoms of his postoperative feet about 2-3 weeks ago,  without significant pain, no fever or chills, he was sent by his primary care physician from office to emergency room to ruled out osteomyelitis. MRI of the right foot did not suggest osteomyelitis. His white count was 19,000, hemoglobin 11.9, MCV 76.9, a platelet 630K. He was started on Vancomycin and had bedside debridement on 08/21/14. Blood cultures was negative. Wound cultures apparently growing multiple organisms. He was discharged home on 08/24/14 without oral antibiotics.   He followed with wound care on Monday, 08/26/2014, and  was restarted on doxycycline. His lower extremity swollen and skin erythema has significantly improved. The ulcer on the right calf has healed. The ulcers on the bottom of feet has significantly improved also.   Our medical records showed he had normal WBC and platelet count up to January 2015. Patient states he had on-and-off anemia for several years in the past, he was told he had iron deficient anemia and was taking iron pill on and off. He did however have  elevated hemoglobin with the highest 18.3 with hematocrit 56.2 in November 2013.  INTERIM HISTORY [name] returns for follow-up. He presents to clinic with his son. Two days ago, he noticed some bruising behind of his right knee,  when his son was changing his wrap. No recent trauma or falls. No change in energy or appetite. He did report feeling short of breath two nights ago but improved with albuterol but no other changes. He also reports knee swelling. He has been follow-up with his surgeon for his foot wounds, and likely could have the left small toe amputated in early August.    MEDICAL HISTORY:  Past Medical History  Diagnosis Date  . Allergy   . Arthritis   . Diabetes mellitus without complication   . Hypertension   . CHF (congestive heart failure)   . GERD (gastroesophageal reflux disease)   . Hyperlipidemia   . CAD (coronary artery disease)     s/p stent placement 2002    SURGICAL HISTORY: Past Surgical History  Procedure Laterality Date  . Joint replacement    . Spine surgery    . Heart stent  2002    SOCIAL HISTORY: History   Social History  . Marital Status: Married    Spouse Name: N/A  . Number of Children: N/A  . Years of Education: N/A   Occupational History  . Not on file.   Social History Main Topics  . Smoking status: Former Smoker -- 2.00 packs/day for 25 years    Types: Cigarettes    Quit date: 08/31/2001  . Smokeless tobacco: Never Used  . Alcohol Use: No  Comment: occ  . Drug Use: No  . Sexual Activity: No   Other Topics Concern  . Not on file   Social History Narrative    FAMILY HISTORY: Family History  Problem Relation Age of Onset  . Cancer Mother     breast  . Diabetes Brother   . Hypertension Brother     ALLERGIES:  is allergic to penicillins.  MEDICATIONS:  Current Outpatient Prescriptions  Medication Sig Dispense Refill  . albuterol (PROVENTIL HFA;VENTOLIN HFA) 108 (90 BASE) MCG/ACT inhaler Inhale 2 puffs into  the lungs every 4 (four) hours as needed for wheezing or shortness of breath (cough, shortness of breath or wheezing.). 1 Inhaler 3  . anagrelide (AGRYLIN) 0.5 MG capsule Take 1 capsule (0.5 mg total) by mouth 2 (two) times daily. 60 capsule 0  . clindamycin (CLEOCIN) 300 MG capsule Take 300 mg by mouth 3 (three) times daily. X 14 days starting 01/16/15    . Coenzyme Q10 (CO Q 10) 100 MG CAPS Take 400 mg by mouth daily.    . ferrous sulfate 325 (65 FE) MG tablet Take 1 tablet (325 mg total) by mouth 2 (two) times daily. 180 tablet 3  . furosemide (LASIX) 40 MG tablet Take two 40 mg tablets by mouth in the AM;  Take one 26m tablet by m outh in the PM 270 tablet 3  . HYDROcodone-acetaminophen (NORCO/VICODIN) 5-325 MG per tablet Take one tablet at night as needed for pain 30 tablet 0  . lisinopril (PRINIVIL,ZESTRIL) 20 MG tablet Take 1 tablet (20 mg total) by mouth daily. (Patient taking differently: Take 40 mg by mouth daily with breakfast. ) 90 tablet 3  . Melatonin 5 MG CAPS Take 5 mg by mouth at bedtime.     . metFORMIN (GLUCOPHAGE-XR) 500 MG 24 hr tablet Take 2 pills (10040m twice daily (Patient taking differently: Take 1,000 mg by mouth 2 (two) times daily. ) 360 tablet 3  . metoprolol (LOPRESSOR) 100 MG tablet Take 1 tablet (100 mg total) by mouth 2 (two) times daily. 180 tablet 3  . minocycline (MINOCIN,DYNACIN) 100 MG capsule Take 100 mg by mouth 2 (two) times daily. X 14 days    . naproxen sodium (ANAPROX) 220 MG tablet Take 440 mg by mouth 2 (two) times daily with a meal.    . omeprazole (PRILOSEC) 20 MG capsule Take 1 capsule (20 mg total) by mouth daily. 90 capsule 3  . ONE TOUCH ULTRA TEST test strip TEST 1-2 TIMES DAILY AS DIRECTED 100 each 2  . potassium chloride SA (K-DUR,KLOR-CON) 20 MEQ tablet Take 1 tablet (20 mEq total) by mouth 2 (two) times daily. 180 tablet 3  . rosuvastatin (CRESTOR) 20 MG tablet Take 1 tablet (20 mg total) by mouth daily. 90 tablet 3  . sitaGLIPtin (JANUVIA)  100 MG tablet Take 1 tablet (100 mg total) by mouth daily. 90 tablet 3  . triamcinolone cream (KENALOG) 0.1 % Apply topically 2 (two) times daily. (Patient taking differently: Apply 1 application topically 2 (two) times daily as needed (for arthritis and itching). ) 480 g 0  . triamterene-hydrochlorothiazide (MAXZIDE-25) 37.5-25 MG per tablet Take 1 tablet by mouth daily.     No current facility-administered medications for this visit.    REVIEW OF SYSTEMS:   Constitutional: Denies fevers, chills or abnormal night sweats Eyes: Denies blurriness of vision, double vision or watery eyes Ears, nose, mouth, throat, and face: Denies mucositis or sore throat Respiratory: Denies cough, dyspnea or wheezes  Cardiovascular: Denies palpitation, chest discomfort or lower extremity swelling Gastrointestinal:  Denies nausea, heartburn or change in bowel habits Skin: Denies abnormal skin rashes Lymphatics: Denies new lymphadenopathy or easy bruising Neurological:Denies numbness, tingling or new weaknesses  Behavioral/Psych: Mood is stable, no new changes  All other systems were reviewed with the patient and are negative.  PHYSICAL EXAMINATION: ECOG PERFORMANCE STATUS: 3  Filed Vitals:   01/16/15 0833  BP: 177/73  Pulse: 85  Temp: 98.6 F (37 C)  Resp: 19   Filed Weights   01/16/15 0833  Weight: 219 lb 11.2 oz (99.655 kg)    GENERAL:alert, no distress and comfortable SKIN: skin color, texture, turgor are normal, no rashes or significant lesions EYES: normal, conjunctiva are pink and non-injected, sclera clear OROPHARYNX:no exudate, no erythema and lips, buccal mucosa, and tongue normal  NECK: supple, thyroid normal size, non-tender, without nodularity LYMPH:  no palpable lymphadenopathy in the cervical, axillary or inguinal LUNGS: clear to auscultation and percussion with normal breathing effort HEART: regular rate & rhythm and no murmurs and no lower extremity edema ABDOMEN:abdomen soft,  non-tender and normal bowel sounds Musculoskeletal:no cyanosis of digits and no clubbing  PSYCH: alert & oriented x 3 with fluent speech NEURO: no focal motor/sensory deficits Ext: Bilateral feet are wrapped with gauze, his son showed me the picture of his wounds   LABORATORY DATA:  I have reviewed the data as listed CBC Latest Ref Rng 01/16/2015 12/24/2014 12/16/2014  WBC 4.0 - 10.3 10e3/uL 21.8(H) 21.1(H) 22.7(H)  Hemoglobin 13.0 - 17.1 g/dL 12.8(L) 14.1 14.0  Hematocrit 38.4 - 49.9 % 39.3 45.0 41.3  Platelets 140 - 400 10e3/uL 676(H) 883(H) 827(H)    CMP Latest Ref Rng 08/29/2014 08/24/2014 08/23/2014  Glucose 70 - 140 mg/dl 101 111(H) 154(H)  BUN 7.0 - 26.0 mg/dL 13.9 13 8   Creatinine 0.7 - 1.3 mg/dL 0.8 0.98 0.80  Sodium 136 - 145 mEq/L 137 138 133(L)  Potassium 3.5 - 5.1 mEq/L 4.1 4.2 3.4(L)  Chloride 96 - 112 mmol/L - 96 98  CO2 22 - 29 mEq/L 28 33(H) 31  Calcium 8.4 - 10.4 mg/dL 9.4 9.2 8.5  Total Protein 6.4 - 8.3 g/dL 6.7 - -  Total Bilirubin 0.20 - 1.20 mg/dL 0.77 - -  Alkaline Phos 40 - 150 U/L 78 - -  AST 5 - 34 U/L 14 - -  ALT 0 - 55 U/L 11 - -     RADIOGRAPHIC STUDIES: MRI abdomen with and without contrast 05/29/2012 IMPRESSION:  1. Structure within the right hepatic lobe has signal and enhancement characteristics compatible with benign hemangioma. 2. Gallstones. 3. Splenomegaly.    ASSESSMENT & PLAN:  74 year old gentleman with past medical history of diabetes, peripheral neuropathy, hypertension, coronary artery disease, recent bilateral foot wounds and cellulitis, presents with leukocytosis, thrombocytosis and anemia. He did have polycythemia in 2013 also.  1. Essential thrombocythemia, JAK2 V617F(+) -He presented with leukocytosis, thrombocytosis and mild anemia. -I reviewed his laboratory findings. 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  -I reviewed in the history of ET, most people do very well, thrombosis is the major complication. Small percentage of people with myelofibrosis at the end stage -Lab reviewed, his platelet count 676 today, WBC 21.8K.  -He is tolerating anagrelide 0.5 mg twice daily well, we'll continue the current dose, and adjust on next  visit if his blood counts still high after surgery   2. iron deficient anemia  -His ferritin was 11, serum iron level was low at 20, saturation was 5%. This is consistent with iron deficient anemia  -He responded well to IV Feraheme, his hemoglobin has normalized afterwards  - I'll refer him back to Dr. Ronalee Red for GI workup, but pt declined    3. Bilateral foot wound, s/p R big toe amputation on 59/74/7185, complicated by osteomyelitis - he will continue follow-up with wound clinic, his surgeon and infection disease doctor  4. Bruising -She is taking ibuprofen 4-6 tablets a day, I think his bruising is related to ibuprofen. Thrombocytosis also may contribute to his bruising -I suggest him to take Tylenol to replace some IBUPROFEN, he also has a prescription for Vicodin he sometimes use.  4. He will continue follow-up with his primary care physician and wound clinic for his diabetes, hypertension, coronary artery disease and wound infection.  Paln -Continue anagrelide 0.5 mg twice daily -Return to clinic in 2 month with CBC and CMP   All questions were answered. The patient knows to call the clinic with any problems, questions or concerns. I spent 20 minutes counseling the patient face to face. The total time spent in the appointment was 25 minutes and more than 50% was on counseling.     Truitt Merle, MD 01/16/2015 10:12 AM

## 2015-01-17 ENCOUNTER — Encounter (HOSPITAL_COMMUNITY): Payer: Self-pay

## 2015-01-20 ENCOUNTER — Other Ambulatory Visit: Payer: Self-pay | Admitting: Foot & Ankle Surgery

## 2015-01-20 DIAGNOSIS — M86672 Other chronic osteomyelitis, left ankle and foot: Secondary | ICD-10-CM

## 2015-01-28 ENCOUNTER — Inpatient Hospital Stay: Admission: RE | Admit: 2015-01-28 | Payer: Medicare HMO | Source: Ambulatory Visit

## 2015-01-28 ENCOUNTER — Ambulatory Visit
Admission: RE | Admit: 2015-01-28 | Discharge: 2015-01-28 | Disposition: A | Discharge status: Home / Self Care | Payer: Medicare HMO | Source: Ambulatory Visit | Attending: Foot & Ankle Surgery | Admitting: Foot & Ankle Surgery

## 2015-01-28 DIAGNOSIS — M86672 Other chronic osteomyelitis, left ankle and foot: Secondary | ICD-10-CM

## 2015-01-28 MED ORDER — GADOBENATE DIMEGLUMINE 529 MG/ML IV SOLN
20.0000 mL | Freq: Once | INTRAVENOUS | Status: AC | PRN
  Administered 2015-01-28: 20 mL via INTRAVENOUS

## 2015-03-13 ENCOUNTER — Encounter: Payer: Self-pay | Admitting: Hematology

## 2015-03-13 ENCOUNTER — Telehealth: Payer: Self-pay | Admitting: Hematology

## 2015-03-13 ENCOUNTER — Ambulatory Visit (HOSPITAL_BASED_OUTPATIENT_CLINIC_OR_DEPARTMENT_OTHER): Payer: Medicare HMO | Admitting: Hematology

## 2015-03-13 ENCOUNTER — Other Ambulatory Visit (HOSPITAL_BASED_OUTPATIENT_CLINIC_OR_DEPARTMENT_OTHER): Payer: Medicare HMO

## 2015-03-13 VITALS — BP 162/60 | HR 86 | Temp 97.9°F | Resp 18 | Ht 69.0 in | Wt 218.3 lb

## 2015-03-13 DIAGNOSIS — D473 Essential (hemorrhagic) thrombocythemia: Secondary | ICD-10-CM

## 2015-03-13 DIAGNOSIS — D72829 Elevated white blood cell count, unspecified: Secondary | ICD-10-CM

## 2015-03-13 DIAGNOSIS — D75839 Thrombocytosis, unspecified: Secondary | ICD-10-CM

## 2015-03-13 DIAGNOSIS — D649 Anemia, unspecified: Secondary | ICD-10-CM

## 2015-03-13 LAB — CBC WITH DIFFERENTIAL/PLATELET
BASO%: 0.3 % (ref 0.0–2.0)
Basophils Absolute: 0.1 10*3/uL (ref 0.0–0.1)
EOS ABS: 0.9 10*3/uL — AB (ref 0.0–0.5)
EOS%: 4.2 % (ref 0.0–7.0)
HCT: 44.9 % (ref 38.4–49.9)
HEMOGLOBIN: 13.7 g/dL (ref 13.0–17.1)
LYMPH%: 2.9 % — AB (ref 14.0–49.0)
MCH: 24.9 pg — ABNORMAL LOW (ref 27.2–33.4)
MCHC: 30.5 g/dL — ABNORMAL LOW (ref 32.0–36.0)
MCV: 81.6 fL (ref 79.3–98.0)
MONO#: 0.8 10*3/uL (ref 0.1–0.9)
MONO%: 3.5 % (ref 0.0–14.0)
NEUT#: 19.4 10*3/uL — ABNORMAL HIGH (ref 1.5–6.5)
NEUT%: 89.1 % — ABNORMAL HIGH (ref 39.0–75.0)
Platelets: 579 10*3/uL — ABNORMAL HIGH (ref 140–400)
RBC: 5.5 10*6/uL (ref 4.20–5.82)
RDW: 17.1 % — AB (ref 11.0–14.6)
WBC: 21.7 10*3/uL — AB (ref 4.0–10.3)
lymph#: 0.6 10*3/uL — ABNORMAL LOW (ref 0.9–3.3)

## 2015-03-13 LAB — COMPREHENSIVE METABOLIC PANEL (CC13)
ALT: 10 U/L (ref 0–55)
AST: 18 U/L (ref 5–34)
Albumin: 3.7 g/dL (ref 3.5–5.0)
Alkaline Phosphatase: 93 U/L (ref 40–150)
Anion Gap: 10 mEq/L (ref 3–11)
BUN: 18.7 mg/dL (ref 7.0–26.0)
CO2: 29 meq/L (ref 22–29)
Calcium: 9.3 mg/dL (ref 8.4–10.4)
Chloride: 103 mEq/L (ref 98–109)
Creatinine: 0.8 mg/dL (ref 0.7–1.3)
Glucose: 138 mg/dl (ref 70–140)
Potassium: 4.5 mEq/L (ref 3.5–5.1)
Sodium: 141 mEq/L (ref 136–145)
Total Bilirubin: 0.83 mg/dL (ref 0.20–1.20)
Total Protein: 6.7 g/dL (ref 6.4–8.3)

## 2015-03-13 MED ORDER — ANAGRELIDE HCL 0.5 MG PO CAPS: 0.5000 mg | ORAL_CAPSULE | Freq: Two times a day (BID) | ORAL | Status: DC

## 2015-03-13 NOTE — Progress Notes (Signed)
[place]  Telephone:(336) [phone] Fax:(336) [phone]  Clinic follow Up Note   Patient Care Team: Darlyne Russian, MD as PCP - General (Family Medicine) Rejeana Brock. Vanessa [place], MD as Attending Physician (Infectious Diseases) 03/13/2015  CHIEF COMPLAINTS:  Follow up ET   HISTORY OF PRESENTING ILLNESS:  [name] 74 y.o. male with medical history significant for DM, peripheral neuropathy and the recent lower extremity wounds, HTN, HLD, CAD, is being referred here to evaluate his abnormal CBC.  He developed bilateral lower extremity swollen, skin erythema, and ulcers on right calf in early January. He was evaluated by his primary care physician, and treated with a course of antibiotics for presumed cellulitis, but these symptoms did not improve significantly. He then developed multiple ulcers on the bottoms of his postoperative feet about 2-3 weeks ago,  without significant pain, no fever or chills, he was sent by his primary care physician from office to emergency room to ruled out osteomyelitis. MRI of the right foot did not suggest osteomyelitis. His white count was 19,000, hemoglobin 11.9, MCV 76.9, a platelet 630K. He was started on Vancomycin and had bedside debridement on 08/21/14. Blood cultures was negative. Wound cultures apparently growing multiple organisms. He was discharged home on 08/24/14 without oral antibiotics.   He followed with wound care on Monday, 08/26/2014, and  was restarted on doxycycline. His lower extremity swollen and skin erythema has significantly improved. The ulcer on the right calf has healed. The ulcers on the bottom of feet has significantly improved also.   Our medical records showed he had normal WBC and platelet count up to January 2015. Patient states he had on-and-off anemia for several years in the past, he was told he had iron deficient anemia and was taking iron pill on and off. He did however have elevated hemoglobin with the highest  18.3 with hematocrit 56.2 in November 2013.  INTERIM HISTORY [name] returns for follow-up. He presents to clinic with his son. He had left toe amputation about 6 weeks ago, and the wound has healed very well. He still has a residual small wound on each foot, and is following with wound clinic. He completed a months of anagrelide, but did not call me to refill, and he has not been taking it for 6 weeks. He otherwise feels well overall, no other new complaints.  MEDICAL HISTORY:  Past Medical History  Diagnosis Date  . Allergy   . Arthritis   . Diabetes mellitus without complication   . Hypertension   . CHF (congestive heart failure)   . GERD (gastroesophageal reflux disease)   . Hyperlipidemia   . CAD (coronary artery disease)     s/p stent placement 2002    SURGICAL HISTORY: Past Surgical History  Procedure Laterality Date  . Joint replacement    . Spine surgery    . Heart stent  2002    SOCIAL HISTORY: Social History   Social History  . Marital Status: Married    Spouse Name: N/A  . Number of Children: N/A  . Years of Education: N/A   Occupational History  . Not on file.   Social History Main Topics  . Smoking status: Former Smoker -- 2.00 packs/day for 25 years    Types: Cigarettes    Quit date: 08/31/2001  . Smokeless tobacco: Never Used  . Alcohol Use: No     Comment: occ  . Drug Use: No  . Sexual Activity: No   Other Topics Concern  .  Not on file   Social History Narrative    FAMILY HISTORY: Family History  Problem Relation Age of Onset  . Cancer Mother     breast  . Diabetes Brother   . Hypertension Brother     ALLERGIES:  is allergic to penicillins.  MEDICATIONS:  Current Outpatient Prescriptions  Medication Sig Dispense Refill  . albuterol (PROVENTIL HFA;VENTOLIN HFA) 108 (90 BASE) MCG/ACT inhaler Inhale 2 puffs into the lungs every 4 (four) hours as needed for wheezing or shortness of breath (cough, shortness of breath or wheezing.).  1 Inhaler 3  . ciprofloxacin (CIPRO) 500 MG tablet Take 500 mg by mouth.    . Coenzyme Q10 (CO Q 10) 100 MG CAPS Take 400 mg by mouth daily.    . ferrous sulfate 325 (65 FE) MG tablet Take 1 tablet (325 mg total) by mouth 2 (two) times daily. 180 tablet 3  . furosemide (LASIX) 40 MG tablet Take two 40 mg tablets by mouth in the AM;  Take one 39m tablet by m outh in the PM 270 tablet 3  . HYDROcodone-acetaminophen (NORCO/VICODIN) 5-325 MG per tablet Take 1-2 tablets po, q 6hrs, prn pain    . lisinopril (PRINIVIL,ZESTRIL) 20 MG tablet Take 1 tablet (20 mg total) by mouth daily. (Patient taking differently: Take 40 mg by mouth daily with breakfast. ) 90 tablet 3  . Melatonin 5 MG CAPS Take 5 mg by mouth at bedtime.     . metFORMIN (GLUCOPHAGE-XR) 500 MG 24 hr tablet Take 2 pills (10064m twice daily (Patient taking differently: Take 1,000 mg by mouth 2 (two) times daily. ) 360 tablet 3  . metoprolol (LOPRESSOR) 100 MG tablet Take 1 tablet (100 mg total) by mouth 2 (two) times daily. 180 tablet 3  . naproxen sodium (ANAPROX) 220 MG tablet Take 440 mg by mouth 2 (two) times daily with a meal.    . omeprazole (PRILOSEC) 20 MG capsule Take 1 capsule (20 mg total) by mouth daily. 90 capsule 3  . ONE TOUCH ULTRA TEST test strip TEST 1-2 TIMES DAILY AS DIRECTED 100 each 2  . potassium chloride SA (K-DUR,KLOR-CON) 20 MEQ tablet Take 1 tablet (20 mEq total) by mouth 2 (two) times daily. 180 tablet 3  . rosuvastatin (CRESTOR) 20 MG tablet Take 1 tablet (20 mg total) by mouth daily. 90 tablet 3  . SANTYL ointment   3  . sitaGLIPtin (JANUVIA) 100 MG tablet Take 1 tablet (100 mg total) by mouth daily. 90 tablet 3  . triamcinolone cream (KENALOG) 0.1 % Apply topically 2 (two) times daily. (Patient taking differently: Apply 1 application topically 2 (two) times daily as needed (for arthritis and itching). ) 480 g 0  . triamterene-hydrochlorothiazide (MAXZIDE-25) 37.5-25 MG per tablet Take 1 tablet by mouth daily.      No current facility-administered medications for this visit.    REVIEW OF SYSTEMS:   Constitutional: Denies fevers, chills or abnormal night sweats Eyes: Denies blurriness of vision, double vision or watery eyes Ears, nose, mouth, throat, and face: Denies mucositis or sore throat Respiratory: Denies cough, dyspnea or wheezes Cardiovascular: Denies palpitation, chest discomfort or lower extremity swelling Gastrointestinal:  Denies nausea, heartburn or change in bowel habits Skin: Denies abnormal skin rashes Lymphatics: Denies new lymphadenopathy or easy bruising Neurological:Denies numbness, tingling or new weaknesses  Behavioral/Psych: Mood is stable, no new changes  All other systems were reviewed with the patient and are negative.  PHYSICAL EXAMINATION: ECOG PERFORMANCE STATUS: 3  Filed  Vitals:   03/13/15 0847  BP: 162/60  Pulse: 86  Temp: 97.9 F (36.6 C)  Resp: 18   Filed Weights   03/13/15 0847  Weight: 218 lb 4.8 oz (99.02 kg)    GENERAL:alert, no distress and comfortable, sitting in wheelchair  SKIN: skin color, texture, turgor are normal, no rashes or significant lesions EYES: normal, conjunctiva are pink and non-injected, sclera clear OROPHARYNX:no exudate, no erythema and lips, buccal mucosa, and tongue normal  NECK: supple, thyroid normal size, non-tender, without nodularity LYMPH:  no palpable lymphadenopathy in the cervical, axillary or inguinal LUNGS: clear to auscultation and percussion with normal breathing effort HEART: regular rate & rhythm and no murmurs and no lower extremity edema ABDOMEN:abdomen soft, non-tender and normal bowel sounds Musculoskeletal:no cyanosis of digits and no clubbing  PSYCH: alert & oriented x 3 with fluent speech NEURO: no focal motor/sensory deficits Ext: Bilateral feet are wrapped with gauze.   LABORATORY DATA:  I have reviewed the data as listed CBC Latest Ref Rng 03/13/2015 01/16/2015 12/24/2014  WBC 4.0 - 10.3 10e3/uL  21.7(H) 21.8(H) 21.1(H)  Hemoglobin 13.0 - 17.1 g/dL 13.7 12.8(L) 14.1  Hematocrit 38.4 - 49.9 % 44.9 39.3 45.0  Platelets 140 - 400 10e3/uL 579(H) 676(H) 883(H)    CMP Latest Ref Rng 03/13/2015 08/29/2014 08/24/2014  Glucose 70 - 140 mg/dl 138 101 111(H)  BUN 7.0 - 26.0 mg/dL 18.7 13.9 13  Creatinine 0.7 - 1.3 mg/dL 0.8 0.8 0.98  Sodium 136 - 145 mEq/L 141 137 138  Potassium 3.5 - 5.1 mEq/L 4.5 4.1 4.2  Chloride 96 - 112 mmol/L - - 96  CO2 22 - 29 mEq/L 29 28 33(H)  Calcium 8.4 - 10.4 mg/dL 9.3 9.4 9.2  Total Protein 6.4 - 8.3 g/dL 6.7 6.7 -  Total Bilirubin 0.20 - 1.20 mg/dL 0.83 0.77 -  Alkaline Phos 40 - 150 U/L 93 78 -  AST 5 - 34 U/L 18 14 -  ALT 0 - 55 U/L 10 11 -     RADIOGRAPHIC STUDIES: MRI abdomen with and without contrast 05/29/2012 IMPRESSION:  1. Structure within the right hepatic lobe has signal and enhancement characteristics compatible with benign hemangioma. 2. Gallstones. 3. Splenomegaly.    ASSESSMENT & PLAN:  74 year old gentleman with past medical history of diabetes, peripheral neuropathy, hypertension, coronary artery disease, recent bilateral foot wounds and cellulitis, presents with leukocytosis, thrombocytosis and anemia. He did have polycythemia in 2013 also.  1. Essential thrombocythemia, JAK2 V617F(+) -He presented with leukocytosis, thrombocytosis and mild anemia. -I reviewed his laboratory findings. 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  -I reviewed in the history of ET, most people do very well, thrombosis is the major complication. Small percentage of people with myelofibrosis at the end stage -Lab reviewed, his platelet count 579K today, WBC 21.7K. he probably has some degree of reactive leukocytosis secondary to his  wound. -He run out of anagrelide 6 weeks, I'll restart him at the same dose 0.5 mg twice a day. -He did have some bruises after taking anagrelide last time, I'll check his CBC in 2 weeks, and see him in 4 weeks.   2. iron deficient anemia  -His ferritin was 11, serum iron level was low at 20, saturation was 5%. This is consistent with iron deficient anemia  -He responded well to IV Feraheme, his  hemoglobin has normalized afterwards  - I recommend him to follow up with Dr. Ronalee Red for GI workup, but pt declined    3. Bilateral foot wound, s/p R big toe amputation on 14/27/6701, complicated by osteomyelitis - he will continue follow-up with wound clinic, his surgeon and infection disease doctor  4. He will continue follow-up with his primary care physician and wound clinic for his diabetes, hypertension, coronary artery disease and wound infection.  Paln -restart anagrelide 0.5 mg twice daily, I called in today  -Return to clinic in 2 weeks for lab, and see me back in 4 weeks with lab   All questions were answered. The patient knows to call the clinic with any problems, questions or concerns. I spent 20 minutes counseling the patient face to face. The total time spent in the appointment was 25 minutes and more than 50% was on counseling.     Truitt Merle, MD 03/13/2015 9:07 AM

## 2015-03-13 NOTE — Telephone Encounter (Signed)
per pof to sch pt appt-gave pt copy of avs °

## 2015-03-14 MED ORDER — ANAGRELIDE HCL 0.5 MG PO CAPS: 0.5000 mg | ORAL_CAPSULE | Freq: Two times a day (BID) | ORAL | Status: DC

## 2015-03-14 NOTE — Addendum Note (Signed)
Addended by: Ignacia Felling on: 03/14/2015 02:34 PM   Modules accepted: Orders

## 2015-03-14 NOTE — Progress Notes (Signed)
Anagralide e-prescribe failed.  Called in to pharmacy 03-14-15/Jan [facility]

## 2015-03-27 ENCOUNTER — Other Ambulatory Visit (HOSPITAL_BASED_OUTPATIENT_CLINIC_OR_DEPARTMENT_OTHER): Payer: Medicare HMO

## 2015-03-27 DIAGNOSIS — D473 Essential (hemorrhagic) thrombocythemia: Secondary | ICD-10-CM

## 2015-03-27 DIAGNOSIS — D72829 Elevated white blood cell count, unspecified: Secondary | ICD-10-CM

## 2015-03-27 DIAGNOSIS — D649 Anemia, unspecified: Secondary | ICD-10-CM

## 2015-03-27 DIAGNOSIS — D75839 Thrombocytosis, unspecified: Secondary | ICD-10-CM

## 2015-03-27 LAB — CBC WITH DIFFERENTIAL/PLATELET
BASO%: 0.3 % (ref 0.0–2.0)
Basophils Absolute: 0.1 10*3/uL (ref 0.0–0.1)
EOS ABS: 0.7 10*3/uL — AB (ref 0.0–0.5)
EOS%: 2.8 % (ref 0.0–7.0)
HEMATOCRIT: 45.5 % (ref 38.4–49.9)
HEMOGLOBIN: 13.9 g/dL (ref 13.0–17.1)
LYMPH#: 0.6 10*3/uL — AB (ref 0.9–3.3)
LYMPH%: 2.4 % — AB (ref 14.0–49.0)
MCH: 24.1 pg — ABNORMAL LOW (ref 27.2–33.4)
MCHC: 30.5 g/dL — AB (ref 32.0–36.0)
MCV: 78.9 fL — ABNORMAL LOW (ref 79.3–98.0)
MONO#: 0.8 10*3/uL (ref 0.1–0.9)
MONO%: 3 % (ref 0.0–14.0)
NEUT%: 91.5 % — ABNORMAL HIGH (ref 39.0–75.0)
NEUTROS ABS: 23.3 10*3/uL — AB (ref 1.5–6.5)
PLATELETS: 695 10*3/uL — AB (ref 140–400)
RBC: 5.77 10*6/uL (ref 4.20–5.82)
RDW: 17.8 % — ABNORMAL HIGH (ref 11.0–14.6)
WBC: 25.4 10*3/uL — AB (ref 4.0–10.3)
nRBC: 0 % (ref 0–0)

## 2015-04-01 ENCOUNTER — Encounter: Payer: Self-pay | Admitting: Emergency Medicine

## 2015-04-10 ENCOUNTER — Telehealth: Payer: Self-pay | Admitting: Hematology

## 2015-04-10 NOTE — Telephone Encounter (Signed)
pt cld to r/s appt-gave pt r/s time & date °

## 2015-04-14 ENCOUNTER — Ambulatory Visit (HOSPITAL_BASED_OUTPATIENT_CLINIC_OR_DEPARTMENT_OTHER): Payer: Medicare HMO

## 2015-04-14 ENCOUNTER — Ambulatory Visit (HOSPITAL_BASED_OUTPATIENT_CLINIC_OR_DEPARTMENT_OTHER): Payer: Medicare HMO | Admitting: Hematology

## 2015-04-14 ENCOUNTER — Telehealth: Payer: Self-pay | Admitting: Hematology

## 2015-04-14 ENCOUNTER — Encounter: Payer: Self-pay | Admitting: Hematology

## 2015-04-14 ENCOUNTER — Other Ambulatory Visit (HOSPITAL_BASED_OUTPATIENT_CLINIC_OR_DEPARTMENT_OTHER): Payer: Medicare HMO

## 2015-04-14 ENCOUNTER — Other Ambulatory Visit: Payer: Medicare HMO

## 2015-04-14 ENCOUNTER — Ambulatory Visit: Payer: Medicare HMO | Admitting: Hematology

## 2015-04-14 VITALS — BP 163/76 | HR 91 | Temp 98.7°F | Resp 18 | Ht 69.0 in | Wt 218.1 lb

## 2015-04-14 DIAGNOSIS — D509 Iron deficiency anemia, unspecified: Secondary | ICD-10-CM | POA: Diagnosis not present

## 2015-04-14 DIAGNOSIS — D473 Essential (hemorrhagic) thrombocythemia: Secondary | ICD-10-CM | POA: Diagnosis not present

## 2015-04-14 DIAGNOSIS — M25561 Pain in right knee: Secondary | ICD-10-CM

## 2015-04-14 DIAGNOSIS — D649 Anemia, unspecified: Secondary | ICD-10-CM

## 2015-04-14 DIAGNOSIS — I10 Essential (primary) hypertension: Secondary | ICD-10-CM

## 2015-04-14 DIAGNOSIS — M25562 Pain in left knee: Secondary | ICD-10-CM

## 2015-04-14 DIAGNOSIS — D72829 Elevated white blood cell count, unspecified: Secondary | ICD-10-CM

## 2015-04-14 DIAGNOSIS — E119 Type 2 diabetes mellitus without complications: Secondary | ICD-10-CM

## 2015-04-14 DIAGNOSIS — D75839 Thrombocytosis, unspecified: Secondary | ICD-10-CM

## 2015-04-14 DIAGNOSIS — I2510 Atherosclerotic heart disease of native coronary artery without angina pectoris: Secondary | ICD-10-CM | POA: Diagnosis not present

## 2015-04-14 LAB — CBC WITH DIFFERENTIAL/PLATELET
BASO%: 0.8 % (ref 0.0–2.0)
BASOS ABS: 0.2 10*3/uL — AB (ref 0.0–0.1)
EOS ABS: 0.5 10*3/uL (ref 0.0–0.5)
EOS%: 1.9 % (ref 0.0–7.0)
HCT: 41.6 % (ref 38.4–49.9)
HEMOGLOBIN: 13 g/dL (ref 13.0–17.1)
LYMPH#: 0.7 10*3/uL — AB (ref 0.9–3.3)
LYMPH%: 2.6 % — ABNORMAL LOW (ref 14.0–49.0)
MCH: 22.8 pg — ABNORMAL LOW (ref 27.2–33.4)
MCHC: 31.2 g/dL — ABNORMAL LOW (ref 32.0–36.0)
MCV: 73.2 fL — AB (ref 79.3–98.0)
MONO#: 0.7 10*3/uL (ref 0.1–0.9)
MONO%: 2.5 % (ref 0.0–14.0)
NEUT#: 25.5 10*3/uL — ABNORMAL HIGH (ref 1.5–6.5)
NEUT%: 92.2 % — ABNORMAL HIGH (ref 39.0–75.0)
NRBC: 0 % (ref 0–0)
PLATELETS: 916 10*3/uL — AB (ref 140–400)
RBC: 5.68 10*6/uL (ref 4.20–5.82)
RDW: 19.2 % — AB (ref 11.0–14.6)
WBC: 27.7 10*3/uL — ABNORMAL HIGH (ref 4.0–10.3)

## 2015-04-14 MED ORDER — HYDROCODONE-ACETAMINOPHEN 5-325 MG PO TABS: ORAL_TABLET | ORAL | Status: DC

## 2015-04-14 MED ORDER — ANAGRELIDE HCL 0.5 MG PO CAPS: ORAL_CAPSULE | ORAL | Status: DC

## 2015-04-14 NOTE — Progress Notes (Signed)
[place]  [phone] [phone]  Clinic follow Up Note   Patient Care Team: Darlyne Russian, MD as PCP - General (Family Medicine) Rejeana Brock. Vanessa Portage, MD as Attending Physician (Infectious Diseases) 04/14/2015  CHIEF COMPLAINTS:  Follow up ET   HISTORY OF PRESENTING ILLNESS:  [name] 74 y.o. male with medical history significant for DM, peripheral neuropathy and the recent lower extremity wounds, HTN, HLD, CAD, is being referred here to evaluate his abnormal CBC.  He developed bilateral lower extremity swollen, skin erythema, and ulcers on right calf in early January. He was evaluated by his primary care physician, and treated with a course of antibiotics for presumed cellulitis, but these symptoms did not improve significantly. He then developed multiple ulcers on the bottoms of his postoperative feet about 2-3 weeks ago,  without significant pain, no fever or chills, he was sent by his primary care physician from office to emergency room to ruled out osteomyelitis. MRI of the right foot did not suggest osteomyelitis. His white count was 19,000, hemoglobin 11.9, MCV 76.9, a platelet 630K. He was started on Vancomycin and had bedside debridement on 08/21/14. Blood cultures was negative. Wound cultures apparently growing multiple organisms. He was discharged home on 08/24/14 without oral antibiotics.   He followed with wound care on Monday, 08/26/2014, and  was restarted on doxycycline. His lower extremity swollen and skin erythema has significantly improved. The ulcer on the right calf has healed. The ulcers on the bottom of feet has significantly improved also.   Our medical records showed he had normal WBC and platelet count up to January 2015. Patient states he had on-and-off anemia for several years in the past, he was told he had iron deficient anemia and was taking iron pill on and off. He did however have elevated hemoglobin with the highest  18.3 with hematocrit 56.2 in November 2013.  INTERIM HISTORY [name] returns for follow-up. He presents to clinic with his son. His bilateral feet wounds are stable, small and shallow now, but not healing well. No other new wounds. He denies any fever, leg swollen or other new symptoms. He has chronic bilateral knee pain from his previous surgery, he takes hydrocodone 2 tablets a day. He is running out today. He is compliant with anagrelide, takes one tablets twice a day, no noticeable side effects. No easy bruising or signs of bleeding. He takes Aleve 2 tablets a day for his knee pain.   MEDICAL HISTORY:  Past Medical History  Diagnosis Date  . Allergy   . Arthritis   . Diabetes mellitus without complication ([place])   . Hypertension   . CHF (congestive heart failure) ([place])   . GERD (gastroesophageal reflux disease)   . Hyperlipidemia   . CAD (coronary artery disease)     s/p stent placement 2002    SURGICAL HISTORY: Past Surgical History  Procedure Laterality Date  . Joint replacement    . Spine surgery    . Heart stent  2002    SOCIAL HISTORY: Social History   Social History  . Marital Status: Married    Spouse Name: N/A  . Number of Children: N/A  . Years of Education: N/A   Occupational History  . Not on file.   Social History Main Topics  . Smoking status: Former Smoker -- 2.00 packs/day for 25 years    Types: Cigarettes    Quit date: 08/31/2001  . Smokeless tobacco: Never Used  . Alcohol Use:  No     Comment: occ  . Drug Use: No  . Sexual Activity: No   Other Topics Concern  . Not on file   Social History Narrative    FAMILY HISTORY: Family History  Problem Relation Age of Onset  . Cancer Mother     breast  . Diabetes Brother   . Hypertension Brother     ALLERGIES:  is allergic to penicillins.  MEDICATIONS:  Current Outpatient Prescriptions  Medication Sig Dispense Refill  . albuterol (PROVENTIL HFA;VENTOLIN HFA) 108 (90 BASE) MCG/ACT  inhaler Inhale 2 puffs into the lungs every 4 (four) hours as needed for wheezing or shortness of breath (cough, shortness of breath or wheezing.). 1 Inhaler 3  . anagrelide (AGRYLIN) 0.5 MG capsule 2 tab in morning, and 1 tab in evening 90 capsule 3  . Coenzyme Q10 (CO Q 10) 100 MG CAPS Take 400 mg by mouth daily.    . ferrous sulfate 325 (65 FE) MG tablet Take 1 tablet (325 mg total) by mouth 2 (two) times daily. 180 tablet 3  . furosemide (LASIX) 40 MG tablet Take two 40 mg tablets by mouth in the AM;  Take one 2m tablet by m outh in the PM 270 tablet 3  . lisinopril (PRINIVIL,ZESTRIL) 20 MG tablet Take 1 tablet (20 mg total) by mouth daily. (Patient taking differently: Take 40 mg by mouth daily with breakfast. ) 90 tablet 3  . Melatonin 5 MG CAPS Take 5 mg by mouth at bedtime.     . metFORMIN (GLUCOPHAGE-XR) 500 MG 24 hr tablet Take 2 pills (10060m twice daily (Patient taking differently: Take 1,000 mg by mouth 2 (two) times daily. ) 360 tablet 3  . metoprolol (LOPRESSOR) 100 MG tablet Take 1 tablet (100 mg total) by mouth 2 (two) times daily. 180 tablet 3  . naproxen sodium (ANAPROX) 220 MG tablet Take 440 mg by mouth 2 (two) times daily with a meal.    . omeprazole (PRILOSEC) 20 MG capsule Take 1 capsule (20 mg total) by mouth daily. 90 capsule 3  . ONE TOUCH ULTRA TEST test strip TEST 1-2 TIMES DAILY AS DIRECTED 100 each 2  . potassium chloride SA (K-DUR,KLOR-CON) 20 MEQ tablet Take 1 tablet (20 mEq total) by mouth 2 (two) times daily. 180 tablet 3  . rosuvastatin (CRESTOR) 20 MG tablet Take 1 tablet (20 mg total) by mouth daily. 90 tablet 3  . SANTYL ointment   3  . sitaGLIPtin (JANUVIA) 100 MG tablet Take 1 tablet (100 mg total) by mouth daily. 90 tablet 3  . triamcinolone cream (KENALOG) 0.1 % Apply topically 2 (two) times daily. (Patient taking differently: Apply 1 application topically 2 (two) times daily as needed (for arthritis and itching). ) 480 g 0  .  triamterene-hydrochlorothiazide (MAXZIDE-25) 37.5-25 MG per tablet Take 1 tablet by mouth daily.    . Marland KitchenYDROcodone-acetaminophen (NORCO/VICODIN) 5-325 MG tablet Take 1 tablets po, q8hrs, prn pain 30 tablet 0   No current facility-administered medications for this visit.    REVIEW OF SYSTEMS:   Constitutional: Denies fevers, chills or abnormal night sweats Eyes: Denies blurriness of vision, double vision or watery eyes Ears, nose, mouth, throat, and face: Denies mucositis or sore throat Respiratory: Denies cough, dyspnea or wheezes Cardiovascular: Denies palpitation, chest discomfort or lower extremity swelling Gastrointestinal:  Denies nausea, heartburn or change in bowel habits Skin: Denies abnormal skin rashes Lymphatics: Denies new lymphadenopathy or easy bruising Neurological:Denies numbness, tingling or new weaknesses  Behavioral/Psych: Mood is stable, no new changes  All other systems were reviewed with the patient and are negative.  PHYSICAL EXAMINATION: ECOG PERFORMANCE STATUS: 3  Filed Vitals:   04/14/15 1514  BP: 163/76  Pulse: 91  Temp: 98.7 F (37.1 C)  Resp: 18   Filed Weights   04/14/15 1514  Weight: 218 lb 1.6 oz (98.93 kg)    GENERAL:alert, no distress and comfortable, sitting in wheelchair  SKIN: skin color, texture, turgor are normal, no rashes or significant lesions EYES: normal, conjunctiva are pink and non-injected, sclera clear OROPHARYNX:no exudate, no erythema and lips, buccal mucosa, and tongue normal  NECK: supple, thyroid normal size, non-tender, without nodularity LYMPH:  no palpable lymphadenopathy in the cervical, axillary or inguinal LUNGS: clear to auscultation and percussion with normal breathing effort HEART: regular rate & rhythm and no murmurs and no lower extremity edema ABDOMEN:abdomen soft, non-tender and normal bowel sounds Musculoskeletal:no cyanosis of digits and no clubbing  PSYCH: alert & oriented x 3 with fluent speech NEURO:  no focal motor/sensory deficits Ext: Bilateral feet are wrapped with gauze.   LABORATORY DATA:  I have reviewed the data as listed CBC Latest Ref Rng 04/14/2015 03/27/2015 03/13/2015  WBC 4.0 - 10.3 10e3/uL 27.7(H) 25.4(H) 21.7(H)  Hemoglobin 13.0 - 17.1 g/dL 13.0 13.9 13.7  Hematocrit 38.4 - 49.9 % 41.6 45.5 44.9  Platelets 140 - 400 10e3/uL 916(H) 695(H) 579(H)    CMP Latest Ref Rng 03/13/2015 08/29/2014 08/24/2014  Glucose 70 - 140 mg/dl 138 101 111(H)  BUN 7.0 - 26.0 mg/dL 18.7 13.9 13  Creatinine 0.7 - 1.3 mg/dL 0.8 0.8 0.98  Sodium 136 - 145 mEq/L 141 137 138  Potassium 3.5 - 5.1 mEq/L 4.5 4.1 4.2  Chloride 96 - 112 mmol/L - - 96  CO2 22 - 29 mEq/L 29 28 33(H)  Calcium 8.4 - 10.4 mg/dL 9.3 9.4 9.2  Total Protein 6.4 - 8.3 g/dL 6.7 6.7 -  Total Bilirubin 0.20 - 1.20 mg/dL 0.83 0.77 -  Alkaline Phos 40 - 150 U/L 93 78 -  AST 5 - 34 U/L 18 14 -  ALT 0 - 55 U/L 10 11 -     RADIOGRAPHIC STUDIES: MRI abdomen with and without contrast 05/29/2012 IMPRESSION:  1. Structure within the right hepatic lobe has signal and enhancement characteristics compatible with benign hemangioma. 2. Gallstones. 3. Splenomegaly.    ASSESSMENT & PLAN:  74 year old gentleman with past medical history of diabetes, peripheral neuropathy, hypertension, coronary artery disease, recent bilateral foot wounds and cellulitis, presents with leukocytosis, thrombocytosis and anemia. He did have polycythemia in 2013 also.  1. Essential thrombocythemia, JAK2 [id number](+) -He presented with leukocytosis, thrombocytosis and mild anemia. -I reviewed his laboratory findings. 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  -I reviewed in the history of ET, most people do very well, thrombosis is the major  complication. Small percentage of people with myelofibrosis at the end stage -Lab reviewed, his platelet count has been slowly increasing lately, 916K today. Given his prior history of iron deficient anemia and newly developed low MCV, I suspect his thrombocytosis be partially related to his iron deficient anemia. -I'll increase his anagrelide to 1.0 mg in the morning and 0.5 mg in the evening -Repeat a CBC every 2 weeks -I discussed the risk of thrombosis and bleeding from severe thrombocytosis. He  is taking Aleve 2 tablets a day for his arthritis.   2. iron deficient anemia  -His ferritin was 11, serum iron level was low at 20, saturation was 5%. This is consistent with iron deficient anemia  -He responded well to IV Feraheme, his hemoglobin has normalized afterwards  - I recommend him to follow up with Dr. Ronalee Red for GI workup, but pt declined  -His hemoglobin is normal, but his MCV dropped again. I'll repeat his ferritin and the iron level today. If it's low, I'll set up his IV Feraheme -His is taking over-the-counter iron pill, 5 tablets a day, equivalence to ferrous sulfate 325 underwent twice a day   3. 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 He will continue follow-up with his primary care physician and wound clinic for his diabetes, hypertension, coronary artery disease and wound infection.  Paln -increase anagrelide to 1.28m am, and 0.5 mg twice daily, I called in today  -Return to clinic in 2 weeks for lab, and see me back in 4 weeks with lab  -repeat iron study today, and iv feraheme if flow  -I refilled his hydrocodone for 30 tablets today. I encouraged him to follow-up with his primary care physician for future prescription.  All questions were answered. The patient knows to call the clinic with any problems, questions or concerns. I  spent 20 minutes counseling the patient face to face. The total time spent in the appointment was 25 minutes and more than 50% was on counseling.     FTruitt Merle MD 04/14/2015 4:05 PM

## 2015-04-14 NOTE — Telephone Encounter (Signed)
Gave adn printed appt sched and avs fo rpt for NOV °

## 2015-04-15 LAB — IRON AND TIBC CHCC
%SAT: 8 % — AB (ref 20–55)
Iron: 23 ug/dL — ABNORMAL LOW (ref 42–163)
TIBC: 288 ug/dL (ref 202–409)
UIBC: 264 ug/dL (ref 117–376)

## 2015-04-15 LAB — FERRITIN CHCC: Ferritin: 28 ng/ml (ref 22–316)

## 2015-04-16 ENCOUNTER — Ambulatory Visit (INDEPENDENT_AMBULATORY_CARE_PROVIDER_SITE_OTHER): Payer: Medicare HMO | Admitting: Emergency Medicine

## 2015-04-16 VITALS — BP 140/82 | HR 98 | Temp 98.5°F | Resp 17 | Ht 69.5 in | Wt 222.0 lb

## 2015-04-16 DIAGNOSIS — I2510 Atherosclerotic heart disease of native coronary artery without angina pectoris: Secondary | ICD-10-CM

## 2015-04-16 DIAGNOSIS — D72829 Elevated white blood cell count, unspecified: Secondary | ICD-10-CM | POA: Diagnosis not present

## 2015-04-16 DIAGNOSIS — I5021 Acute systolic (congestive) heart failure: Secondary | ICD-10-CM

## 2015-04-16 DIAGNOSIS — R0602 Shortness of breath: Secondary | ICD-10-CM

## 2015-04-16 DIAGNOSIS — G629 Polyneuropathy, unspecified: Secondary | ICD-10-CM

## 2015-04-16 DIAGNOSIS — I2583 Coronary atherosclerosis due to lipid rich plaque: Secondary | ICD-10-CM

## 2015-04-16 DIAGNOSIS — E0869 Diabetes mellitus due to underlying condition with other specified complication: Secondary | ICD-10-CM

## 2015-04-16 DIAGNOSIS — R601 Generalized edema: Secondary | ICD-10-CM

## 2015-04-16 LAB — BASIC METABOLIC PANEL WITH GFR
BUN: 18 mg/dL (ref 7–25)
CALCIUM: 9.1 mg/dL (ref 8.6–10.3)
CO2: 29 mmol/L (ref 20–31)
Chloride: 99 mmol/L (ref 98–110)
Creat: 0.6 mg/dL — ABNORMAL LOW (ref 0.70–1.18)
Glucose, Bld: 118 mg/dL — ABNORMAL HIGH (ref 65–99)
Potassium: 4.4 mmol/L (ref 3.5–5.3)
SODIUM: 140 mmol/L (ref 135–146)

## 2015-04-16 LAB — HEMOGLOBIN A1C: HEMOGLOBIN A1C: 5.4 % (ref 4.0–6.0)

## 2015-04-16 LAB — BRAIN NATRIURETIC PEPTIDE: BRAIN NATRIURETIC PEPTIDE: 586.8 pg/mL — AB (ref 0.0–100.0)

## 2015-04-16 LAB — GLUCOSE, POCT (MANUAL RESULT ENTRY): POC Glucose: 141 mg/dl — AB (ref 70–99)

## 2015-04-16 LAB — POCT GLYCOSYLATED HEMOGLOBIN (HGB A1C): Hemoglobin A1C: 5.4

## 2015-04-16 MED ORDER — HYDROCODONE-ACETAMINOPHEN 5-325 MG PO TABS: ORAL_TABLET | ORAL | Status: AC

## 2015-04-16 MED ORDER — METOLAZONE 2.5 MG PO TABS: ORAL_TABLET | ORAL | Status: AC

## 2015-04-16 NOTE — Addendum Note (Signed)
Addended by: [name] A on: 04/16/2015 03:22 PM   Modules accepted: Orders

## 2015-04-16 NOTE — Patient Instructions (Signed)
Please stop your Maxide. I placed you on Zaroxolyn to take a tablet every other day. You can decrease your metformin to 2 tablets once a day.

## 2015-04-16 NOTE — Progress Notes (Addendum)
This chart was scribed for [name], MD by Moises Blood, Medical Scribe. This patient was seen in Room 11 and the patient's care was started 8:43 AM.  Chief Complaint:  Chief Complaint  Patient presents with  . Leg Pain  . Knee Pain  . Hip Pain  . Depression    HPI: [name] is a 74 y.o. male who reports to [facility] today complaining of leg pain with knee and hip pain.   Leg Pain He states that his legs are in pain due to swelling. He goes to the wound center every 2 weeks. He takes 2 lasix a day, 1 in morning and 1 at night. He also noted large lack of sleep due to pain in his right hip. He was given hydrocodone by his oncologist, Dr. Burr Medico.   DM His sugars ran 117 this morning. He usually sees them around low 100s. He hasn't seen his cardiologist. He hasn't seen his eye doctor this year.   Cough His son noticed that the pt has some wet coughs lately.   Depression He also has some depression with his wife recently passing, about a month ago. She passed with COPD.   He declines a flu vaccine today.  He is brought in with his son today.   Past Medical History  Diagnosis Date  . Allergy   . Arthritis   . Diabetes mellitus without complication ([place])   . Hypertension   . CHF (congestive heart failure) ([place])   . GERD (gastroesophageal reflux disease)   . Hyperlipidemia   . CAD (coronary artery disease)     s/p stent placement 2002   Past Surgical History  Procedure Laterality Date  . Joint replacement    . Spine surgery    . Heart stent  2002   Social History   Social History  . Marital Status: Married    Spouse Name: N/A  . Number of Children: N/A  . Years of Education: N/A   Social History Main Topics  . Smoking status: Former Smoker -- 2.00 packs/day for 25 years    Types: Cigarettes    Quit date: 08/31/2001  . Smokeless tobacco: Never Used  . Alcohol Use: No     Comment: occ  . Drug Use: No  . Sexual Activity: No   Other Topics Concern  .  None   Social History Narrative   Family History  Problem Relation Age of Onset  . Cancer Mother     breast  . Diabetes Brother   . Hypertension Brother    Allergies  Allergen Reactions  . Penicillins Shortness Of Breath   Prior to Admission medications   Medication Sig Start Date End Date Taking? Authorizing Provider  albuterol (PROVENTIL HFA;VENTOLIN HFA) 108 (90 BASE) MCG/ACT inhaler Inhale 2 puffs into the lungs every 4 (four) hours as needed for wheezing or shortness of breath (cough, shortness of breath or wheezing.). 08/22/14   Darlyne Russian, MD  anagrelide Arsenio Loader) 0.5 MG capsule 2 tab in morning, and 1 tab in evening 04/14/15   Truitt Merle, MD  Coenzyme Q10 (CO Q 10) 100 MG CAPS Take 400 mg by mouth daily.    Historical Provider, MD  ferrous sulfate 325 (65 FE) MG tablet Take 1 tablet (325 mg total) by mouth 2 (two) times daily. 07/07/14   Darlyne Russian, MD  furosemide (LASIX) 40 MG tablet Take two 40 mg tablets by mouth in the AM;  Take one 40mg  tablet  by m outh in the PM 07/07/14   Darlyne Russian, MD  HYDROcodone-acetaminophen (NORCO/VICODIN) 5-325 MG tablet Take 1 tablets po, q8hrs, prn pain 04/14/15   Truitt Merle, MD  lisinopril (PRINIVIL,ZESTRIL) 20 MG tablet Take 1 tablet (20 mg total) by mouth daily. Patient taking differently: Take 40 mg by mouth daily with breakfast.  07/31/14   Thayer Headings, MD  Melatonin 5 MG CAPS Take 5 mg by mouth at bedtime.     Historical Provider, MD  metFORMIN (GLUCOPHAGE-XR) 500 MG 24 hr tablet Take 2 pills (1000mg ) twice daily Patient taking differently: Take 1,000 mg by mouth 2 (two) times daily.  07/09/14   Darlyne Russian, MD  metoprolol (LOPRESSOR) 100 MG tablet Take 1 tablet (100 mg total) by mouth 2 (two) times daily. 07/09/14   Darlyne Russian, MD  naproxen sodium (ANAPROX) 220 MG tablet Take 440 mg by mouth 2 (two) times daily with a meal.    Historical Provider, MD  omeprazole (PRILOSEC) 20 MG capsule Take 1 capsule (20 mg total) by mouth daily.  07/07/14   Darlyne Russian, MD  ONE TOUCH ULTRA TEST test strip TEST 1-2 TIMES DAILY AS DIRECTED 11/12/14   Darlyne Russian, MD  potassium chloride SA (K-DUR,KLOR-CON) 20 MEQ tablet Take 1 tablet (20 mEq total) by mouth 2 (two) times daily. 07/07/14   Darlyne Russian, MD  rosuvastatin (CRESTOR) 20 MG tablet Take 1 tablet (20 mg total) by mouth daily. 07/07/14   Darlyne Russian, MD  SANTYL ointment  02/28/15   Historical Provider, MD  sitaGLIPtin (JANUVIA) 100 MG tablet Take 1 tablet (100 mg total) by mouth daily. 07/09/14   Darlyne Russian, MD  triamcinolone cream (KENALOG) 0.1 % Apply topically 2 (two) times daily. Patient taking differently: Apply 1 application topically 2 (two) times daily as needed (for arthritis and itching).  07/07/14   Darlyne Russian, MD  triamterene-hydrochlorothiazide (MAXZIDE-25) 37.5-25 MG per tablet Take 1 tablet by mouth daily.    Historical Provider, MD     ROS:  Constitutional: negative for chills, fever, night sweats, weight changes, or fatigue  HEENT: negative for vision changes, hearing loss, congestion, rhinorrhea, ST, epistaxis, or sinus pressure Cardiovascular: negative for chest pain or palpitations; positive for leg swelling Respiratory: negative for hemoptysis, wheezing, shortness of breath, or cough Abdominal: negative for abdominal pain, nausea, vomiting, diarrhea, or constipation Dermatological: negative for rash Neurologic: negative for headache, dizziness, or syncope Musc: positive for hip pain, knee pain All other systems reviewed and are otherwise negative with the exception to those above and in the HPI.  PHYSICAL EXAM: Filed Vitals:   04/16/15 0820  BP: 154/94  Pulse: 98  Temp: 98.5 F (36.9 C)  Resp: 17   Body mass index is 32.32 kg/(m^2).   General: Alert, no acute distress HEENT:  Normocephalic, atraumatic, oropharynx patent. Eye: [facility] Cardiovascular:  Regular rate and rhythm, no rubs murmurs or gallops.  No Carotid bruits, radial pulse  intact. pedal edema bilaterally Respiratory: Clear to auscultation bilaterally.  No wheezes, rales, or rhonchi.  No cyanosis, no use of accessory musculature Abdominal: No organomegaly, abdomen is soft and non-tender, positive bowel sounds. No masses. Musculoskeletal: uses a cane to assist walking, edema in legs bilaterally Skin: No rashes. Neurologic: Facial musculature symmetric. Psychiatric: Patient acts appropriately throughout our interaction.  Lymphatic: No cervical or submandibular lymphadenopathy Genitourinary/Anorectal: No acute findings   LABS: Results for orders placed or performed in visit on 04/14/15  Iron and TIBC [facility]  Result Value Ref Range   Iron 23 (L) 42 - 163 ug/dL   TIBC 288 202 - 409 ug/dL   UIBC 264 117 - 376 ug/dL   %SAT 8 (L) 20 - 55 %  Ferritin  Result Value Ref Range   Ferritin 28 22 - 316 ng/ml   Results for orders placed or performed in visit on 04/16/15  POCT glucose (manual entry)  Result Value Ref Range   POC Glucose 141 (A) 70 - 99 mg/dl  POCT glycosylated hemoglobin (Hb A1C)  Result Value Ref Range   Hemoglobin A1C 5.4    Results for orders placed or performed in visit on 66/06/30  BASIC METABOLIC PANEL WITH GFR  Result Value Ref Range   Sodium 140 135 - 146 mmol/L   Potassium 4.4 3.5 - 5.3 mmol/L   Chloride 99 98 - 110 mmol/L   CO2 29 20 - 31 mmol/L   Glucose, Bld 118 (H) 65 - 99 mg/dL   BUN 18 7 - 25 mg/dL   Creat 0.60 (L) 0.70 - 1.18 mg/dL   Calcium 9.1 8.6 - 10.3 mg/dL   GFR, Est African American >89 >=60 mL/min   GFR, Est Non African American >89 >=60 mL/min  Brain natriuretic peptide  Result Value Ref Range   Brain Natriuretic Peptide 586.8 (H) 0.0 - 100.0 pg/mL  POCT glucose (manual entry)  Result Value Ref Range   POC Glucose 141 (A) 70 - 99 mg/dl  POCT glycosylated hemoglobin (Hb A1C)  Result Value Ref Range   Hemoglobin A1C 5.4    EKG/XRAY:   Primary read interpreted by Dr. Everlene Farrier at [facility]. EKG: rbbb, left interior hemi  block, there is a Q in V3.   ASSESSMENT/PLAN: Patient presents for follow-up. I have not seen him in a number of months. He is had surgery on both feet with removal of toes secondary to infection related to his diabetes and diabetic neuropathy. His wife died approximately 6 weeks ago from respiratory failure related to COPD. He is being cared for by his son and there is quite a bit of tension at home. He declines counseling for depression. His diabetes is under better control. I stopped his metformin he takes at night. His hemoglobin A1c was 5.6 I suspect secondary to his weight loss. He also has a jack 2 mutation and is followed by the oncologist for his myeloproliferative disorder. He refused a flu shot. He refused Pneumovax. We'll recheck in 4-6 weeks. His major complaint was increasing swelling. I added rocks on 2.5 mg to take every 3 days. Will follow-up basic metabolic panel in about 3 weeks. His BNP subsequently returned markedly elevated consistent with congestive failure. Urgent referral made to cardiology.  By signing my name below, I, Moises Blood, attest that this documentation has been prepared under the direction and in the presence of [name], MD. Electronically Signed: Moises Blood, [place]. 04/16/2015 , 8:43 AM .    [place] sideeffects, risk and benefits, and alternatives of medications d/w patient. Patient is aware that all medications have potential sideeffects and we are unable to predict every sideeffect or drug-drug interaction that may occur.  [name] MD 04/16/2015 8:43 AM

## 2015-04-20 ENCOUNTER — Other Ambulatory Visit: Payer: Self-pay | Admitting: Hematology

## 2015-04-21 ENCOUNTER — Telehealth: Payer: Self-pay | Admitting: Hematology

## 2015-04-21 ENCOUNTER — Telehealth: Payer: Self-pay | Admitting: *Deleted

## 2015-04-21 NOTE — Telephone Encounter (Signed)
per pof to sch pt fera-sent MW email to sch-will call pt once reply

## 2015-04-21 NOTE — Telephone Encounter (Signed)
Per staff message and POF I have scheduled appts. Advised scheduler of appts. JMW  

## 2015-04-21 NOTE — Telephone Encounter (Signed)
Message left for pt to return call to discuss lab results.  

## 2015-04-25 ENCOUNTER — Telehealth: Payer: Self-pay | Admitting: Emergency Medicine

## 2015-04-25 ENCOUNTER — Encounter: Payer: Self-pay | Admitting: Family Medicine

## 2015-04-25 NOTE — Telephone Encounter (Signed)
[facility] Cardiovascular department on [facility] has been trying to contact the patient. Referral history is below.  It appears that have called the patient to schedule three times, and either cannot get in touch with him or he has not called them back to schedule.  04/23/2015 04:20 PM Change Scheduling Status From Called 2x to Called 3x Beverlyn Roux 04/18/2015 02:52 PM Change Scheduling Status From Called 1x to Called 2x Cherly Anderson R 04/16/2015 10:28 AM Change Scheduling Status From Ready for Initial Scheduling to Called 1x DAVIS, GESILA C

## 2015-04-25 NOTE — Telephone Encounter (Signed)
Wanted to check on the referral that was placed to have pt seen by cardiology for CHF.  Please advise. thanks

## 2015-04-29 ENCOUNTER — Ambulatory Visit (HOSPITAL_BASED_OUTPATIENT_CLINIC_OR_DEPARTMENT_OTHER): Payer: Medicare HMO

## 2015-04-29 ENCOUNTER — Other Ambulatory Visit: Payer: Self-pay | Admitting: *Deleted

## 2015-04-29 ENCOUNTER — Other Ambulatory Visit: Payer: Self-pay | Admitting: Hematology

## 2015-04-29 VITALS — BP 149/67 | HR 77 | Temp 98.1°F | Resp 19

## 2015-04-29 DIAGNOSIS — D509 Iron deficiency anemia, unspecified: Secondary | ICD-10-CM

## 2015-04-29 MED ORDER — SODIUM CHLORIDE 0.9 % IV SOLN
510.0000 mg | Freq: Once | INTRAVENOUS | Status: AC
  Administered 2015-04-29: 510 mg via INTRAVENOUS
  Filled 2015-04-29: qty 17

## 2015-04-29 MED ORDER — SODIUM CHLORIDE 0.9 % IV SOLN
Freq: Once | INTRAVENOUS | Status: AC
  Administered 2015-04-29: 15:00:00 via INTRAVENOUS

## 2015-04-29 NOTE — Patient Instructions (Signed)

## 2015-04-29 NOTE — Progress Notes (Signed)
Pt finished Feraheme infusion and VSS after 30 minutes post.  Pt informed of upcoming appointment on 11/14 with Dr. Burr Medico and for next infusion.  Pt denies any questions or concerns at time of discharge. Discharged via wheelchair to accompanying family.

## 2015-05-12 ENCOUNTER — Other Ambulatory Visit (HOSPITAL_BASED_OUTPATIENT_CLINIC_OR_DEPARTMENT_OTHER): Payer: Medicare HMO

## 2015-05-12 ENCOUNTER — Telehealth: Payer: Self-pay | Admitting: Hematology

## 2015-05-12 ENCOUNTER — Ambulatory Visit (HOSPITAL_BASED_OUTPATIENT_CLINIC_OR_DEPARTMENT_OTHER): Payer: Medicare HMO | Admitting: Hematology

## 2015-05-12 ENCOUNTER — Encounter: Payer: Self-pay | Admitting: Hematology

## 2015-05-12 ENCOUNTER — Ambulatory Visit (HOSPITAL_BASED_OUTPATIENT_CLINIC_OR_DEPARTMENT_OTHER): Payer: Medicare HMO

## 2015-05-12 VITALS — BP 164/71 | HR 90 | Temp 98.0°F | Resp 20 | Ht 69.5 in | Wt 229.4 lb

## 2015-05-12 VITALS — BP 133/57 | HR 95 | Temp 98.3°F | Resp 20

## 2015-05-12 DIAGNOSIS — D72829 Elevated white blood cell count, unspecified: Secondary | ICD-10-CM

## 2015-05-12 DIAGNOSIS — D509 Iron deficiency anemia, unspecified: Secondary | ICD-10-CM

## 2015-05-12 DIAGNOSIS — D473 Essential (hemorrhagic) thrombocythemia: Secondary | ICD-10-CM

## 2015-05-12 DIAGNOSIS — I10 Essential (primary) hypertension: Secondary | ICD-10-CM

## 2015-05-12 DIAGNOSIS — E119 Type 2 diabetes mellitus without complications: Secondary | ICD-10-CM

## 2015-05-12 DIAGNOSIS — D75839 Thrombocytosis, unspecified: Secondary | ICD-10-CM

## 2015-05-12 DIAGNOSIS — D649 Anemia, unspecified: Secondary | ICD-10-CM

## 2015-05-12 LAB — CBC WITH DIFFERENTIAL/PLATELET
BASO%: 0.7 % (ref 0.0–2.0)
Basophils Absolute: 0.2 10*3/uL — ABNORMAL HIGH (ref 0.0–0.1)
EOS ABS: 0.5 10*3/uL (ref 0.0–0.5)
EOS%: 1.9 % (ref 0.0–7.0)
HEMATOCRIT: 38.5 % (ref 38.4–49.9)
HEMOGLOBIN: 12.6 g/dL — AB (ref 13.0–17.1)
LYMPH#: 0.5 10*3/uL — AB (ref 0.9–3.3)
LYMPH%: 1.9 % — AB (ref 14.0–49.0)
MCH: 24.7 pg — ABNORMAL LOW (ref 27.2–33.4)
MCHC: 32.6 g/dL (ref 32.0–36.0)
MCV: 75.6 fL — AB (ref 79.3–98.0)
MONO#: 0.6 10*3/uL (ref 0.1–0.9)
MONO%: 2.3 % (ref 0.0–14.0)
NEUT%: 93.2 % — ABNORMAL HIGH (ref 39.0–75.0)
NEUTROS ABS: 22.6 10*3/uL — AB (ref 1.5–6.5)
PLATELETS: 760 10*3/uL — AB (ref 140–400)
RBC: 5.1 10*6/uL (ref 4.20–5.82)
RDW: 22.8 % — AB (ref 11.0–14.6)
WBC: 24.2 10*3/uL — AB (ref 4.0–10.3)

## 2015-05-12 LAB — COMPREHENSIVE METABOLIC PANEL (CC13)
ALBUMIN: 2.9 g/dL — AB (ref 3.5–5.0)
ALK PHOS: 140 U/L (ref 40–150)
ALT: 10 U/L (ref 0–55)
ANION GAP: 13 meq/L — AB (ref 3–11)
AST: 13 U/L (ref 5–34)
BILIRUBIN TOTAL: 1.13 mg/dL (ref 0.20–1.20)
BUN: 21.8 mg/dL (ref 7.0–26.0)
CALCIUM: 9.1 mg/dL (ref 8.4–10.4)
CO2: 29 mEq/L (ref 22–29)
CREATININE: 0.8 mg/dL (ref 0.7–1.3)
Chloride: 99 mEq/L (ref 98–109)
Glucose: 164 mg/dl — ABNORMAL HIGH (ref 70–140)
Potassium: 3.7 mEq/L (ref 3.5–5.1)
Sodium: 141 mEq/L (ref 136–145)
TOTAL PROTEIN: 7.1 g/dL (ref 6.4–8.3)

## 2015-05-12 LAB — TECHNOLOGIST REVIEW

## 2015-05-12 MED ORDER — SODIUM CHLORIDE 0.9 % IV SOLN
Freq: Once | INTRAVENOUS | Status: AC
  Administered 2015-05-12: 10:00:00 via INTRAVENOUS

## 2015-05-12 MED ORDER — ANAGRELIDE HCL 0.5 MG PO CAPS: ORAL_CAPSULE | ORAL | Status: AC

## 2015-05-12 MED ORDER — SODIUM CHLORIDE 0.9 % IV SOLN
510.0000 mg | Freq: Once | INTRAVENOUS | Status: AC
  Administered 2015-05-12: 510 mg via INTRAVENOUS
  Filled 2015-05-12: qty 17

## 2015-05-12 NOTE — Progress Notes (Signed)
[place]  [phone] [phone]  Clinic follow Up Note   Patient Care Team: Darlyne Russian, MD as PCP - General (Family Medicine) Rejeana Brock. Vanessa [place], MD as Attending Physician (Infectious Diseases) 05/12/2015  CHIEF COMPLAINTS:  Follow up ET   HISTORY OF PRESENTING ILLNESS:  [name] 74 y.o. male with medical history significant for DM, peripheral neuropathy and the recent lower extremity wounds, HTN, HLD, CAD, is being referred here to evaluate his abnormal CBC.  He developed bilateral lower extremity swollen, skin erythema, and ulcers on right calf in early January. He was evaluated by his primary care physician, and treated with a course of antibiotics for presumed cellulitis, but these symptoms did not improve significantly. He then developed multiple ulcers on the bottoms of his postoperative feet about 2-3 weeks ago,  without significant pain, no fever or chills, he was sent by his primary care physician from office to emergency room to ruled out osteomyelitis. MRI of the right foot did not suggest osteomyelitis. His white count was 19,000, hemoglobin 11.9, MCV 76.9, a platelet 630K. He was started on Vancomycin and had bedside debridement on 08/21/14. Blood cultures was negative. Wound cultures apparently growing multiple organisms. He was discharged home on 08/24/14 without oral antibiotics.   He followed with wound care on Monday, 08/26/2014, and  was restarted on doxycycline. His lower extremity swollen and skin erythema has significantly improved. The ulcer on the right calf has healed. The ulcers on the bottom of feet has significantly improved also.   Our medical records showed he had normal WBC and platelet count up to January 2015. Patient states he had on-and-off anemia for several years in the past, he was told he had iron deficient anemia and was taking iron pill on and off. He did however have elevated hemoglobin with the highest  18.3 with hematocrit 56.2 in November 2013.  CURRENT TREATMENT: Anagrelide, started on 12/27/2014, dose increased to 34m bid on 05/13/2015   INTERIM HISTORY Mr. NHaginsreturns for follow-up. He presents to clinic by himself. He received IV Feraheme 2 weeks ago and tired well. He did not feel much change after the infusion. His bilateral feet wounds are stable, but has not healed. His recent HbA1c was 5.4%, and his primary care physician decreased his metformin dose. He is compliant with anagrelide, tolerating well. No other new complaints.   MEDICAL HISTORY:  Past Medical History  Diagnosis Date  . Allergy   . Arthritis   . Diabetes mellitus without complication ([place]   . Hypertension   . CHF (congestive heart failure) ([place]   . GERD (gastroesophageal reflux disease)   . Hyperlipidemia   . CAD (coronary artery disease)     s/p stent placement 2002    SURGICAL HISTORY: Past Surgical History  Procedure Laterality Date  . Joint replacement    . Spine surgery    . Heart stent  2002    SOCIAL HISTORY: Social History   Social History  . Marital Status: Married    Spouse Name: N/A  . Number of Children: N/A  . Years of Education: N/A   Occupational History  . Not on file.   Social History Main Topics  . Smoking status: Former Smoker -- 2.00 packs/day for 25 years    Types: Cigarettes    Quit date: 08/31/2001  . Smokeless tobacco: Never Used  . Alcohol Use: No     Comment: occ  . Drug Use: No  . Sexual  Activity: No   Other Topics Concern  . Not on file   Social History Narrative    FAMILY HISTORY: Family History  Problem Relation Age of Onset  . Cancer Mother     breast  . Diabetes Brother   . Hypertension Brother     ALLERGIES:  is allergic to penicillins.  MEDICATIONS:  Current Outpatient Prescriptions  Medication Sig Dispense Refill  . albuterol (PROVENTIL HFA;VENTOLIN HFA) 108 (90 BASE) MCG/ACT inhaler Inhale 2 puffs into the lungs every 4 (four)  hours as needed for wheezing or shortness of breath (cough, shortness of breath or wheezing.). 1 Inhaler 3  . anagrelide (AGRYLIN) 0.5 MG capsule 2 tab twice daily 120 capsule 0  . Coenzyme Q10 (CO Q 10) 100 MG CAPS Take 400 mg by mouth daily.    . ferrous sulfate 325 (65 FE) MG tablet Take 1 tablet (325 mg total) by mouth 2 (two) times daily. 180 tablet 3  . HYDROcodone-acetaminophen (NORCO/VICODIN) 5-325 MG tablet Take 1 tablets po, q8hrs, prn pain 60 tablet 0  . lisinopril (PRINIVIL,ZESTRIL) 20 MG tablet Take 1 tablet (20 mg total) by mouth daily. (Patient taking differently: Take 40 mg by mouth daily with breakfast. ) 90 tablet 3  . Melatonin 5 MG CAPS Take 5 mg by mouth at bedtime.     . metFORMIN (GLUCOPHAGE-XR) 500 MG 24 hr tablet Take 2 pills (1028m) twice daily (Patient taking differently: Take 1,000 mg by mouth 2 (two) times daily. ) 360 tablet 3  . metolazone (ZAROXOLYN) 2.5 MG tablet Take 1 tablet every other day. (Patient taking differently: 2.5 mg 2 (two) times daily. Take 1 tablet every other day.) 14 tablet 0  . metoprolol (LOPRESSOR) 100 MG tablet Take 1 tablet (100 mg total) by mouth 2 (two) times daily. 180 tablet 3  . naproxen sodium (ANAPROX) 220 MG tablet Take 440 mg by mouth 2 (two) times daily with a meal.    . omeprazole (PRILOSEC) 20 MG capsule Take 1 capsule (20 mg total) by mouth daily. 90 capsule 3  . ONE TOUCH ULTRA TEST test strip TEST 1-2 TIMES DAILY AS DIRECTED 100 each 2  . potassium chloride SA (K-DUR,KLOR-CON) 20 MEQ tablet Take 1 tablet (20 mEq total) by mouth 2 (two) times daily. 180 tablet 3  . rosuvastatin (CRESTOR) 20 MG tablet Take 1 tablet (20 mg total) by mouth daily. 90 tablet 3  . sitaGLIPtin (JANUVIA) 100 MG tablet Take 1 tablet (100 mg total) by mouth daily. 90 tablet 3  . torsemide (DEMADEX) 20 MG tablet Take 20 mg by mouth 2 (two) times daily.    .Marland Kitchentriamcinolone cream (KENALOG) 0.1 % Apply topically 2 (two) times daily. (Patient taking differently:  Apply 1 application topically 2 (two) times daily as needed (for arthritis and itching). ) 480 g 0  . SANTYL ointment   3   No current facility-administered medications for this visit.    REVIEW OF SYSTEMS:   Constitutional: Denies fevers, chills or abnormal night sweats Eyes: Denies blurriness of vision, double vision or watery eyes Ears, nose, mouth, throat, and face: Denies mucositis or sore throat Respiratory: Denies cough, dyspnea or wheezes Cardiovascular: Denies palpitation, chest discomfort or lower extremity swelling Gastrointestinal:  Denies nausea, heartburn or change in bowel habits Skin: Denies abnormal skin rashes Lymphatics: Denies new lymphadenopathy or easy bruising Neurological:Denies numbness, tingling or new weaknesses  Behavioral/Psych: Mood is stable, no new changes  All other systems were reviewed with the patient and are  negative.  PHYSICAL EXAMINATION: ECOG PERFORMANCE STATUS: 3  Filed Vitals:   05/12/15 0827  BP: 164/71  Pulse: 90  Temp: 98 F (36.7 C)  Resp: 20   Filed Weights   05/12/15 0827  Weight: 229 lb 6.4 oz (104.055 kg)    GENERAL:alert, no distress and comfortable, sitting in wheelchair  SKIN: skin color, texture, turgor are normal, no rashes or significant lesions EYES: normal, conjunctiva are pink and non-injected, sclera clear OROPHARYNX:no exudate, no erythema and lips, buccal mucosa, and tongue normal  NECK: supple, thyroid normal size, non-tender, without nodularity LYMPH:  no palpable lymphadenopathy in the cervical, axillary or inguinal LUNGS: clear to auscultation and percussion with normal breathing effort HEART: regular rate & rhythm and no murmurs and no lower extremity edema ABDOMEN:abdomen soft, non-tender and normal bowel sounds Musculoskeletal:no cyanosis of digits and no clubbing  PSYCH: alert & oriented x 3 with fluent speech NEURO: no focal motor/sensory deficits Ext: Bilateral feet are wrapped with gauze.    LABORATORY DATA:  I have reviewed the data as listed CBC Latest Ref Rng 05/12/2015 04/14/2015 03/27/2015  WBC 4.0 - 10.3 10e3/uL 24.2(H) 27.7(H) 25.4(H)  Hemoglobin 13.0 - 17.1 g/dL 12.6(L) 13.0 13.9  Hematocrit 38.4 - 49.9 % 38.5 41.6 45.5  Platelets 140 - 400 10e3/uL 760(H) 916(H) 695(H)    CMP Latest Ref Rng 04/16/2015 03/13/2015 08/29/2014  Glucose 65 - 99 mg/dL 118(H) 138 101  BUN 7 - 25 mg/dL 18 18.7 13.9  Creatinine 0.70 - 1.18 mg/dL 0.60(L) 0.8 0.8  Sodium 135 - 146 mmol/L 140 141 137  Potassium 3.5 - 5.3 mmol/L 4.4 4.5 4.1  Chloride 98 - 110 mmol/L 99 - -  CO2 20 - 31 mmol/L _0 Calcium 8.6 - 10.3 mg/dL 9.1 9.3 9.4  Total Protein 6.4 - 8.3 g/dL - 6.7 6.7  Total Bilirubin 0.20 - 1.20 mg/dL - 0.83 0.77  Alkaline Phos 40 - 150 U/L - 93 78  AST 5 - 34 U/L - 18 14  ALT 0 - 55 U/L - 10 11     RADIOGRAPHIC STUDIES: MRI abdomen with and without contrast 05/29/2012 IMPRESSION:  1. Structure within the right hepatic lobe has signal and enhancement characteristics compatible with benign hemangioma. 2. Gallstones. 3. Splenomegaly.    ASSESSMENT & PLAN:  74 year old gentleman with past medical history of diabetes, peripheral neuropathy, hypertension, coronary artery disease, recent bilateral foot wounds and cellulitis, presents with leukocytosis, thrombocytosis and anemia. He did have polycythemia in 2013 also.  1. Essential thrombocythemia, JAK2 V617F(+) -He presented with leukocytosis, thrombocytosis and mild anemia. -I reviewed his laboratory findings. 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  -I reviewed in the history of ET, most people do very well, thrombosis is the major complication. Small percentage of people with myelofibrosis at the end  stage -Lab reviewed, his platelet count has come down to 760 K today, from 916 K4 weeks ago. -I'll increase his anagrelide to 1.0 mg twice daily -We'll continue monitor his CBC closely. -I discussed the risk of thrombosis and bleeding from severe thrombocytosis. He is taking Aleve 2 tablets a day for his arthritis.   2. iron deficient anemia  -His ferritin was 11, serum iron level was low at 20, saturation was 5%. This is consistent with iron deficient anemia  -He responded well to IV Feraheme,  his hemoglobin has normalized afterwards  - I recommend him to follow up with Dr. Ronalee Red for GI workup, but pt declined  -He is scheduled to have another dose of ferriheme today. -His is taking over-the-counter iron pill, 5 tablets a day, equivalence to ferrous sulfate 325 underwent twice a day   3. 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 He will continue follow-up with his primary care physician and wound clinic for his diabetes, hypertension, coronary artery disease and wound infection.  Paln -increase anagrelide to 1.27m twice daily, I called in today  -Second dose for him today -Return to clinic in 4 weeks with lab  All questions were answered. The patient knows to call the clinic with any problems, questions or concerns. I spent 20 minutes counseling the patient face to face. The total time spent in the appointment was 25 minutes and more than 50% was on counseling.     FTruitt Merle MD 05/12/2015 8:51 AM

## 2015-05-12 NOTE — Patient Instructions (Signed)

## 2015-05-12 NOTE — Telephone Encounter (Signed)
per pof to sch pt appt-gave pt copy of avs °

## 2015-05-19 ENCOUNTER — Telehealth: Payer: Self-pay

## 2015-05-19 NOTE — Telephone Encounter (Signed)
[name] would like to speak with [name] regarding his dad. Please call [phone], didn't go into details

## 2015-05-20 NOTE — Telephone Encounter (Signed)
Unable to reach Pt. Left VM to call back 

## 2015-05-20 NOTE — Telephone Encounter (Signed)
Call [facility] Mr. Dillon son and tell him I will be calling him later today.

## 2015-05-23 NOTE — Telephone Encounter (Signed)
Did you get to call Fatima Sanger?

## 2015-05-24 NOTE — Telephone Encounter (Signed)
Message left at ICU. Under treatment for sepsis and renal failure.

## 2015-05-24 NOTE — Telephone Encounter (Signed)
Spoke with pt's son, he states all of his questions have been answered for the most part. He had to call an ambulance for his dad and he is now in ICU at [facility].

## 2015-05-24 NOTE — Telephone Encounter (Signed)
I have called twice to reach [name] son. Both times I had to leave a message. I'm waiting to get a call back. You can call today and see if he will return my call.

## 2015-05-27 ENCOUNTER — Telehealth: Payer: Self-pay | Admitting: Emergency Medicine

## 2015-05-27 NOTE — Telephone Encounter (Signed)
Okay to get a follow-up appointment as needed. Okay to [place]

## 2015-05-27 NOTE — Telephone Encounter (Signed)
Sent message to Dr. Everlene Farrier regarding this patient getting a hospital follow-up with him.  I noticed that Dr. Everlene Farrier is completely 100% booked up and I need to see if there is a particular day he would like for him to come in on and a specific time he would like to see this patient for a hospital follow-up.  Maudie Mercury

## 2015-05-27 NOTE — Telephone Encounter (Signed)
Patient needs hospital follow up appointment in 7-10 days. Please advise. No openings found in [facility]'s schedule.  [phone]

## 2015-05-28 NOTE — Telephone Encounter (Signed)
Left message for patient to call back regarding appt for a hospital follow-up with Dr Everlene Farrier.

## 2015-06-10 ENCOUNTER — Other Ambulatory Visit: Payer: Medicare HMO

## 2015-06-10 ENCOUNTER — Encounter: Payer: Self-pay | Admitting: Hematology

## 2015-06-10 ENCOUNTER — Telehealth: Payer: Self-pay | Admitting: Hematology

## 2015-06-10 ENCOUNTER — Encounter: Payer: Medicare HMO | Admitting: Hematology

## 2015-06-10 NOTE — Progress Notes (Signed)
This encounter was created in error - please disregard.

## 2015-06-10 NOTE — Telephone Encounter (Signed)
per pof pt missed appt-cld & left message to call & r/s

## 2015-07-30 DEATH — deceased

## 2015-08-15 ENCOUNTER — Other Ambulatory Visit: Payer: Self-pay | Admitting: Emergency Medicine

## 2016-08-15 IMAGING — CR DG FOOT COMPLETE 3+V*L*
3 series · 3 of 3 positions shown · non-contrast
Comparison: None.

CLINICAL DATA: Pain

EXAM:
LEFT FOOT - COMPLETE 3+ VIEW

[AP]
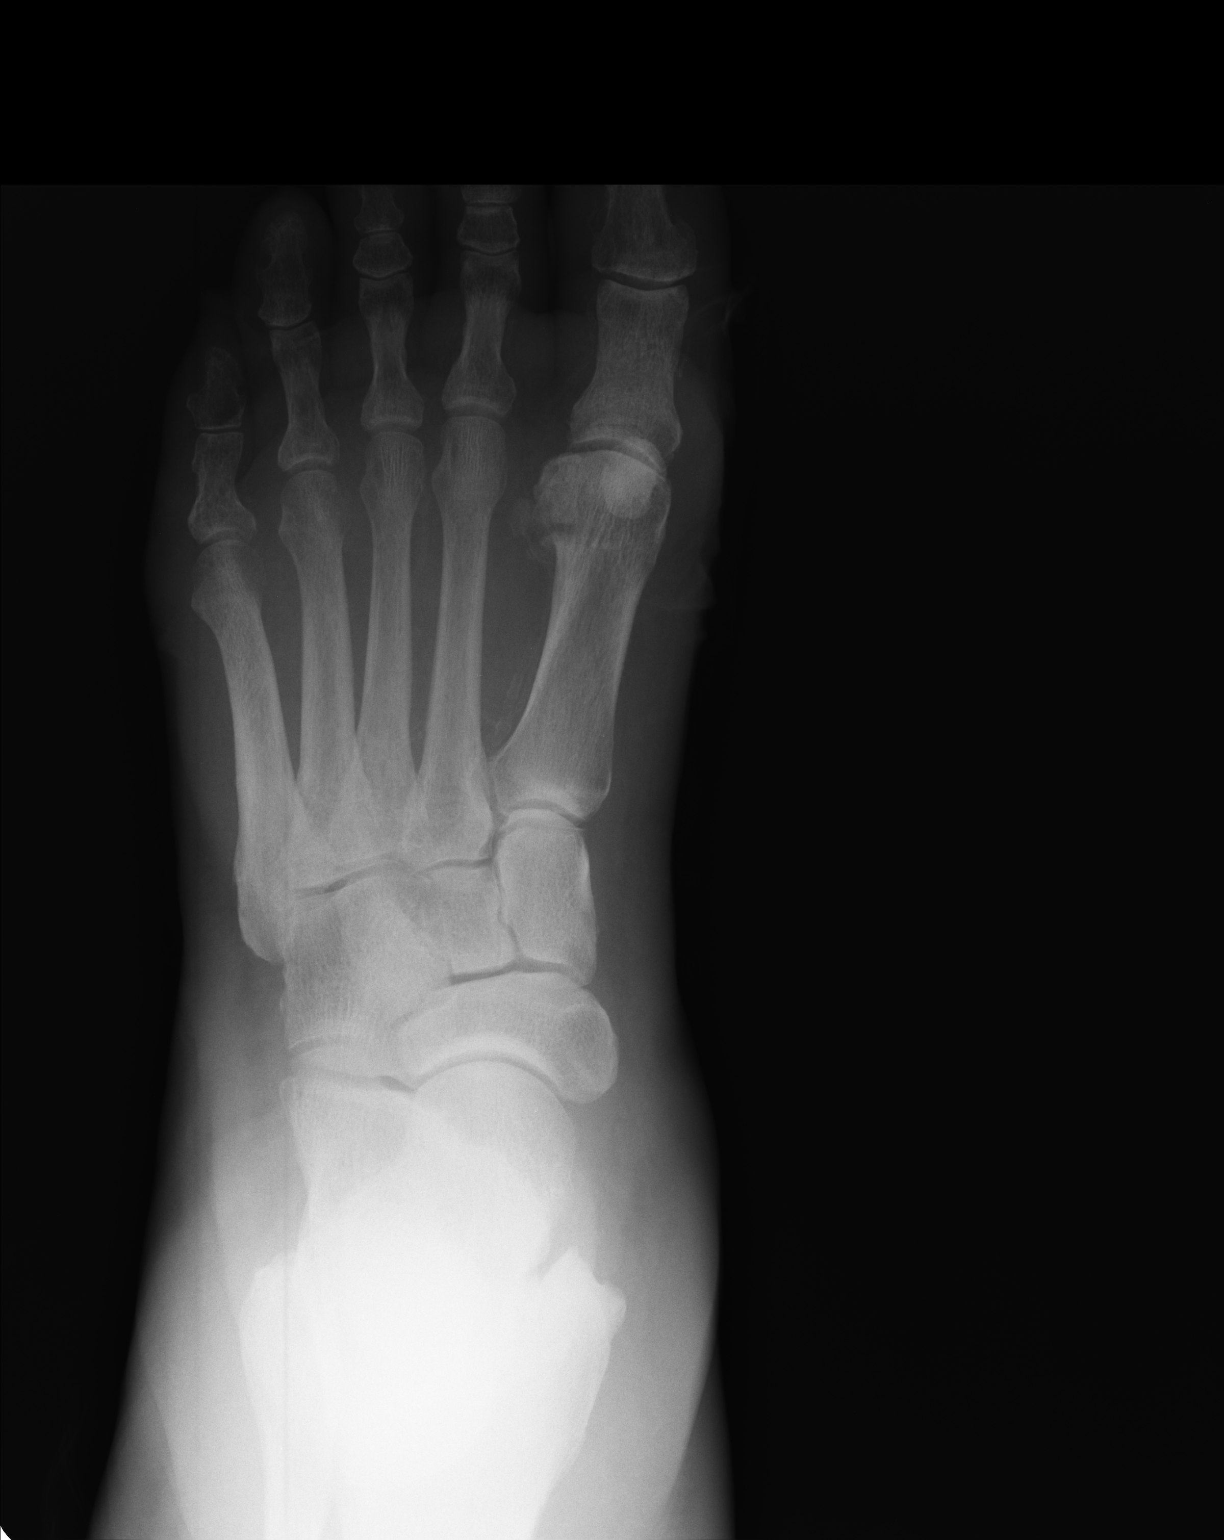

[ap obl int rot]
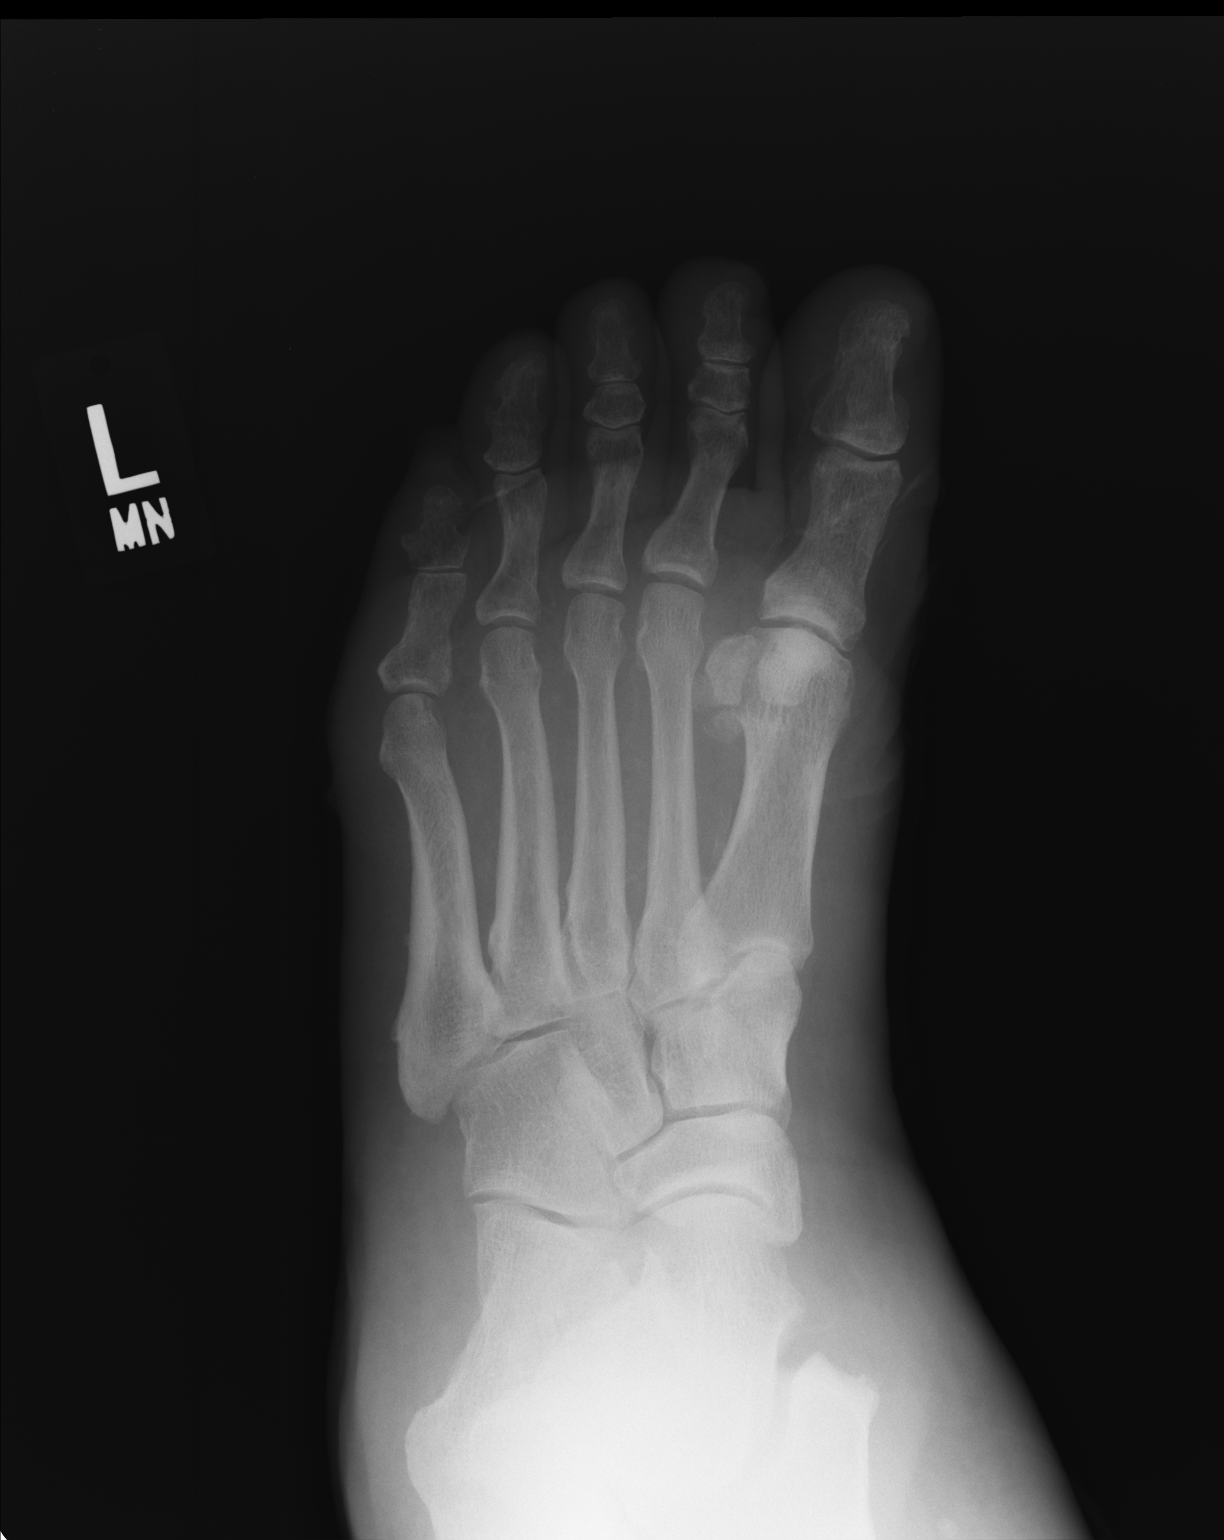

[lateral]
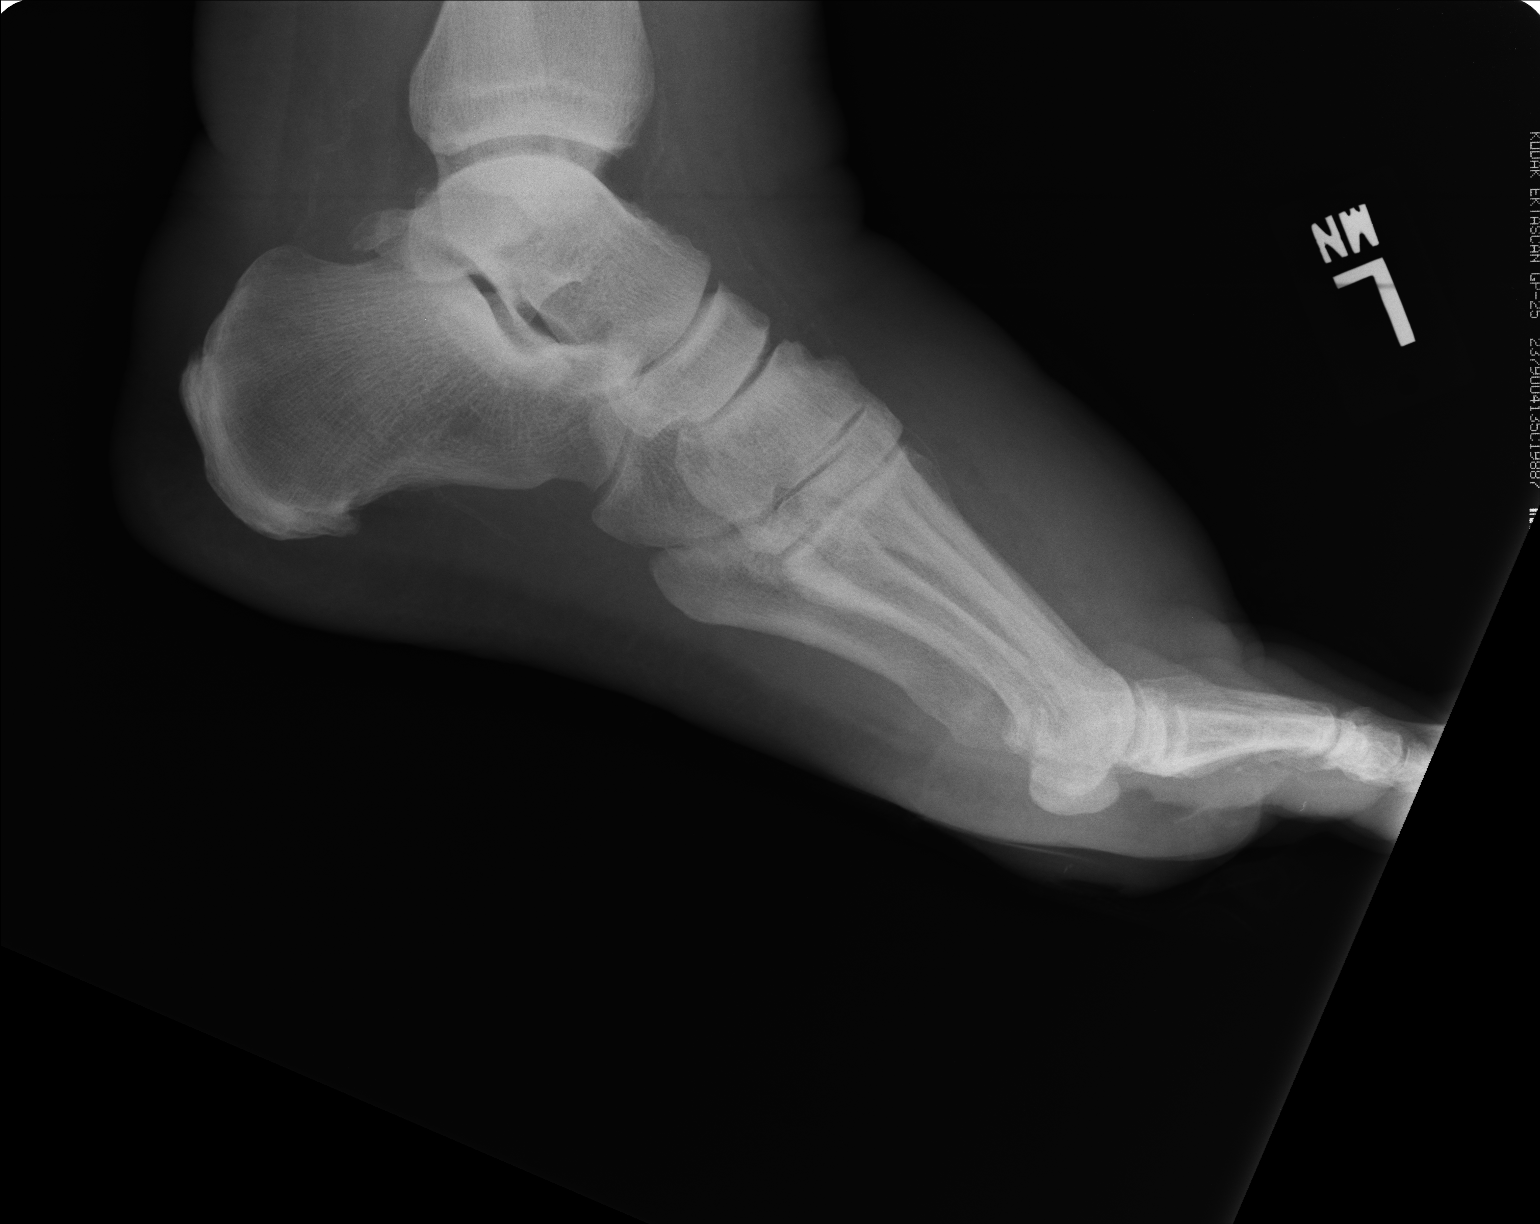

[3 of 3 positions shown; findings below may reference images not displayed]

FINDINGS: There is no evidence of fracture or dislocation. There is no
evidence of arthropathy or other focal bone abnormality. Soft
tissues are unremarkable.
IMPRESSION: Negative.

## 2016-08-18 IMAGING — CR DG FOOT COMPLETE 3+V*L*
3 series · 3 of 3 positions shown · non-contrast
Comparison: August 17, 2014

CLINICAL DATA: Plantar surface soft tissue wounds. Diabetes
mellitus.

EXAM:
LEFT FOOT - COMPLETE 3+ VIEW

[x foot ap left]
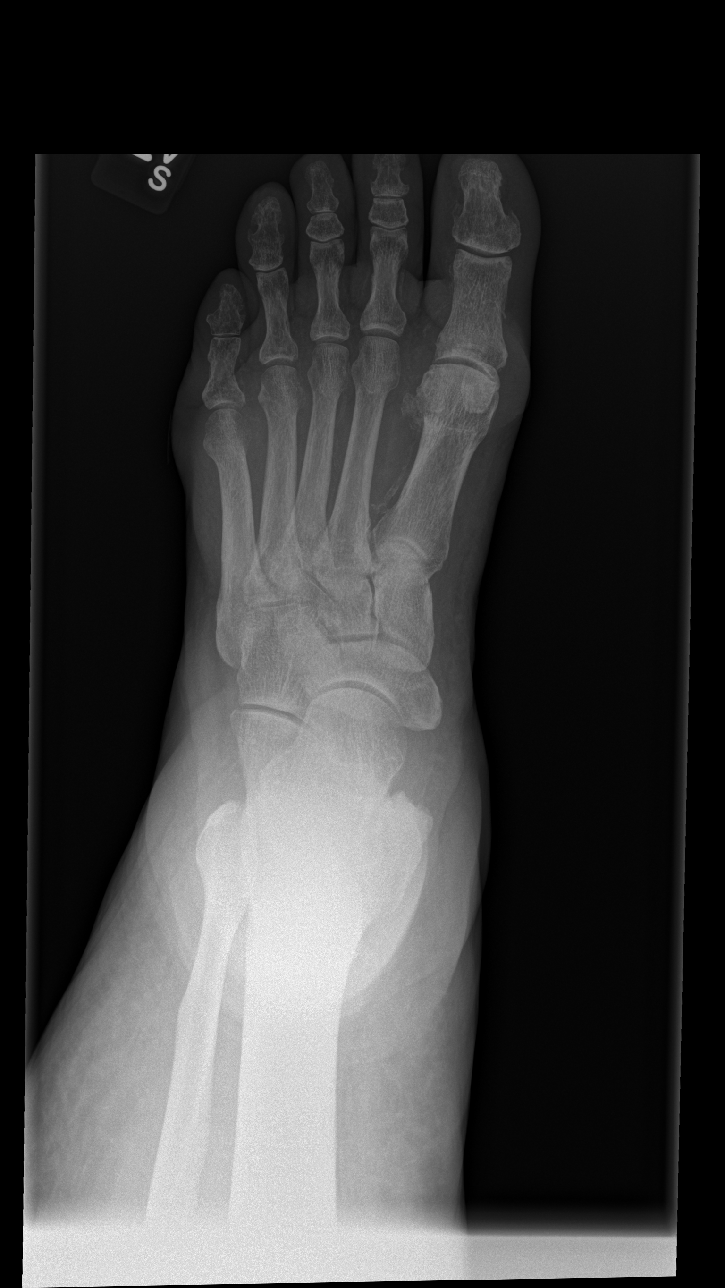

[x foot obl left]
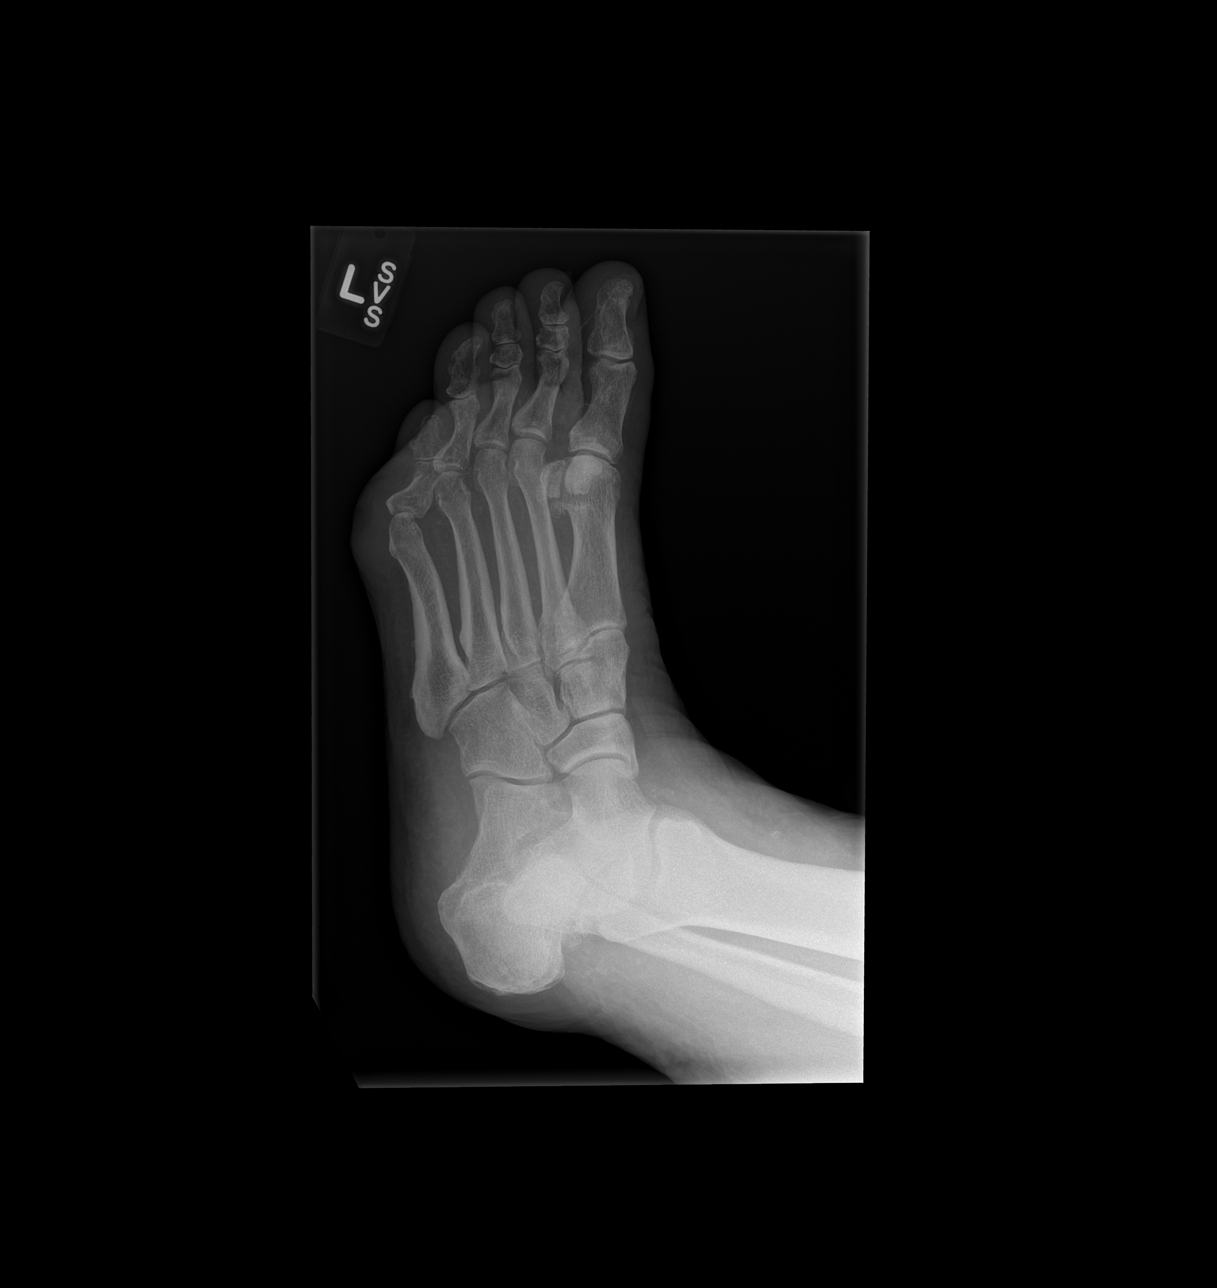

[x foot lat left]
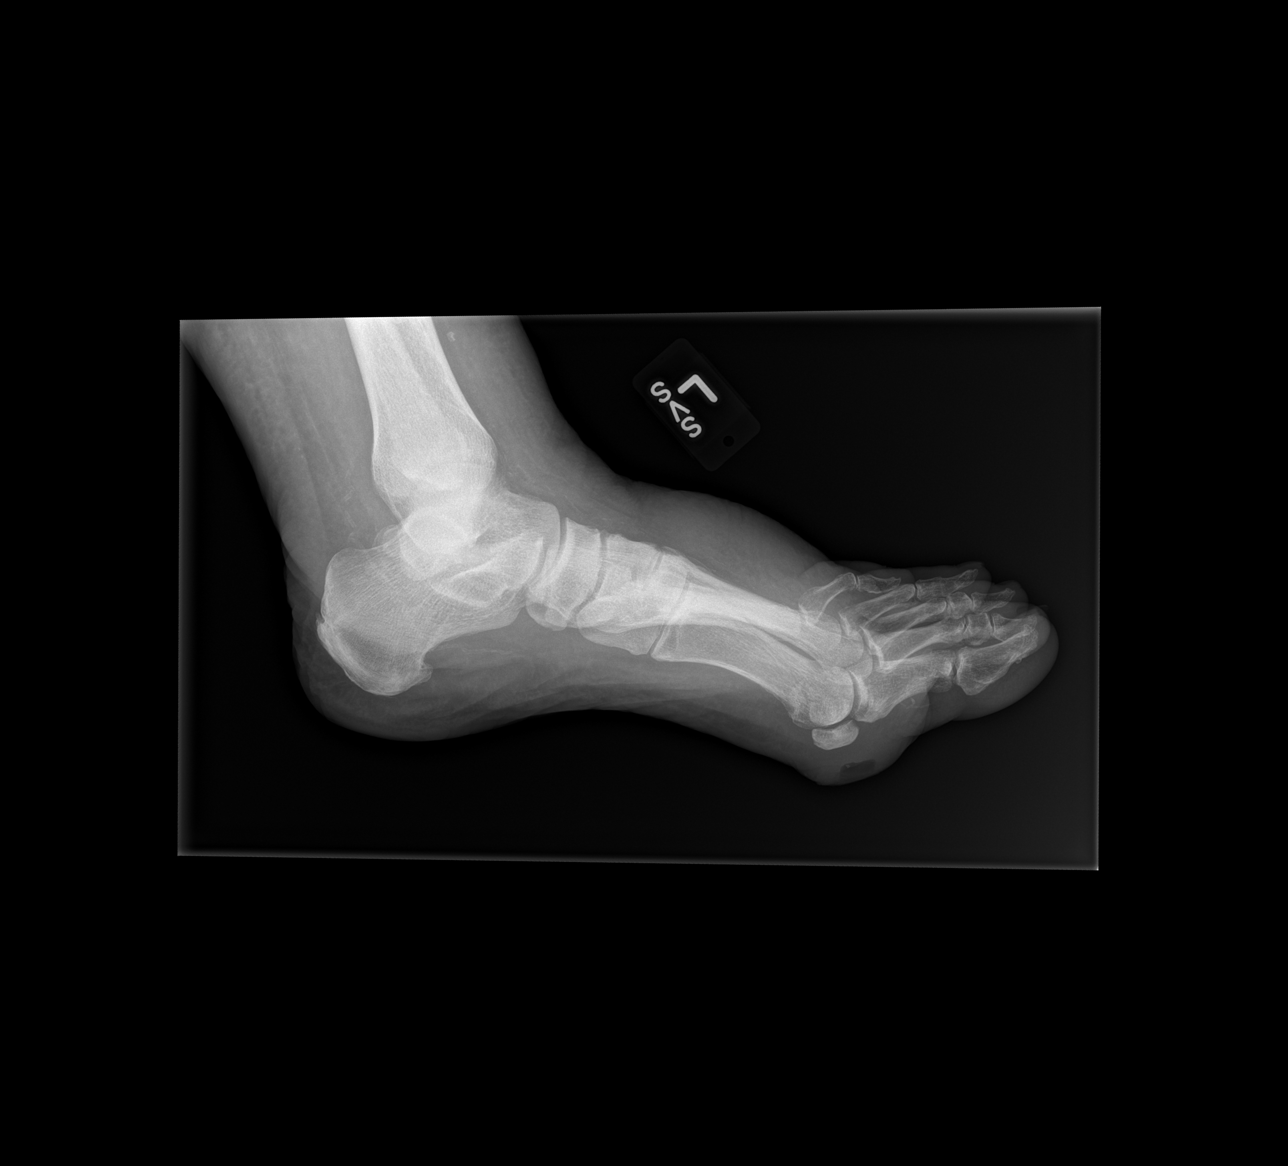

[3 of 3 positions shown; findings below may reference images not displayed]

FINDINGS: Frontal, oblique, and lateral views were obtained. There is marked
soft tissue swelling dorsally. There is a soft tissue ulceration
posterior to the first MTP joint. There is no erosive change. There
is no overt bony destruction. However, there is an area of focal
osteopenia in the medial aspect of the proximal portion of the first
proximal phalanx. There is focal cortical disruption along the
medial mid portion of the first proximal phalanx, likely due to an
avulsion type injury. No other evidence of fracture. No dislocation.
There is mild narrowing of the first MTP joint with soft tissue
swelling in this area.
IMPRESSION: Evidence of soft tissue ulceration volar to the first MTP joint.
There is soft tissue swelling in the region of the first MTP joint.
There is a subtle area of osteopenia in the medial proximal aspect
of the first proximal phalanx. This finding could indicate earliest
changes of osteomyelitis. There is no overt bony destruction,
however.

There is subtle cortical disruption along the medial aspect of the
mid portion of the first proximal phalanx, likely due to avulsion
type injury.

There is marked soft tissue swelling dorsally.

No acute fracture or dislocation. There are foci of arterial
vascular calcification.

With respect to potential osteomyelitis, MR would be the imaging
study of choice to further evaluate. If there is a contraindication
to MR, nuclear medicine 3 phase bone scan could be an alternative
imaging study to further assess.

## 2016-09-04 IMAGING — US US ABDOMEN COMPLETE
1 series · 13 of 25 positions shown · non-contrast
Comparison: MRI 05/29/2012

CLINICAL DATA: Patient with leukocytosis, anemia and
thrombocytosis.

EXAM:
ULTRASOUND ABDOMEN COMPLETE

[Series 1: us abdomen complete · 0.15mm/px · 13 of 115 slices shown]
[im 1/115]
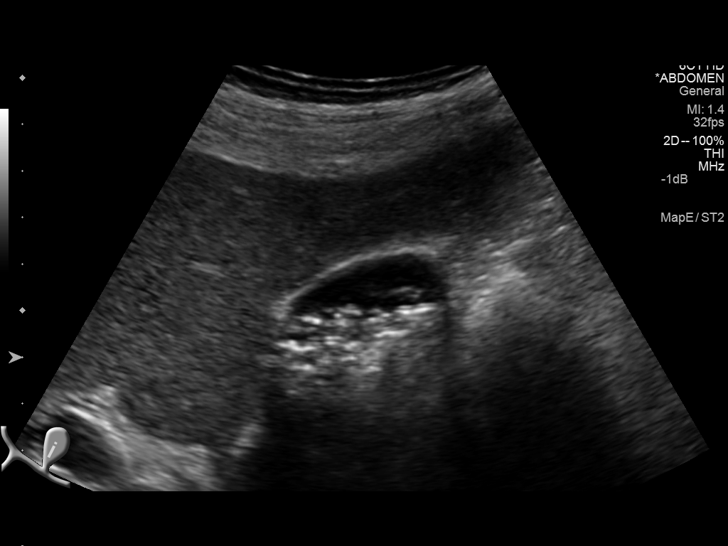
[im 10/115]
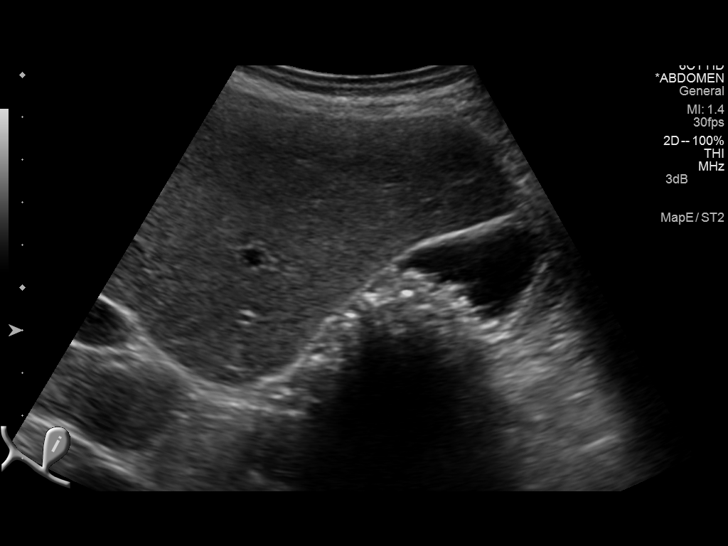
[im 20/115]
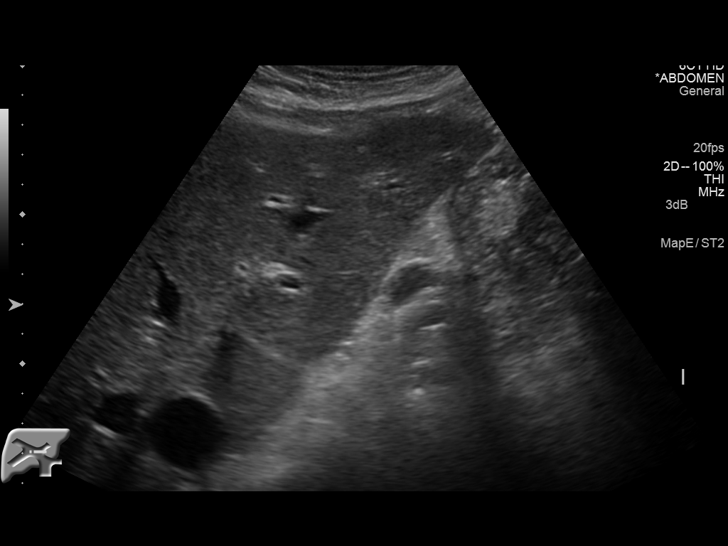
[im 29/115]
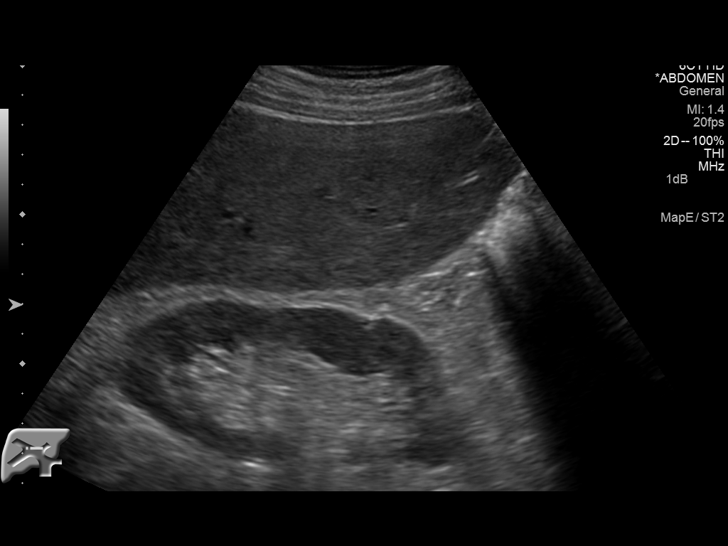
[im 39/115]
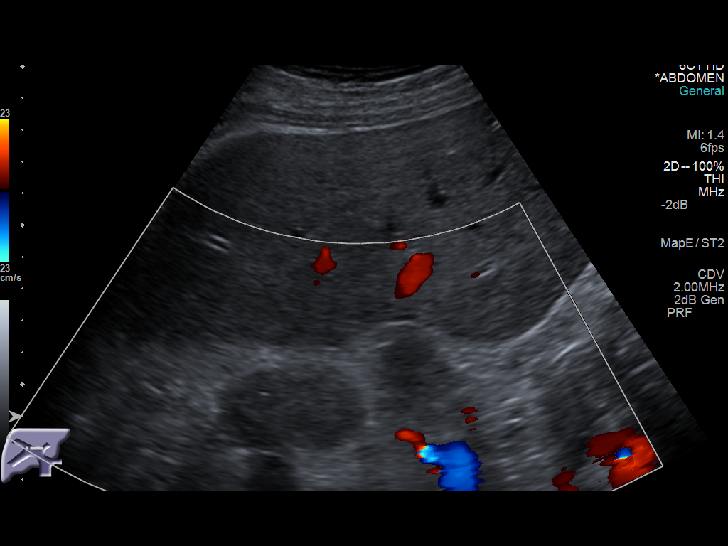
[im 48/115]
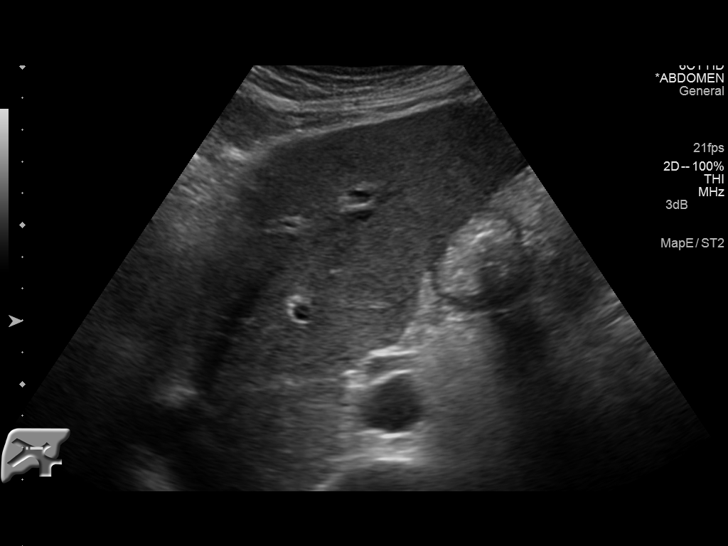
[im 58/115]
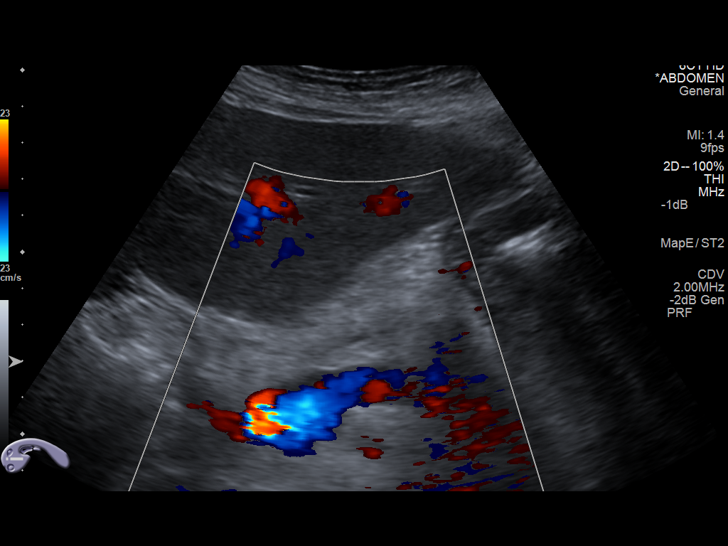
[im 67/115]
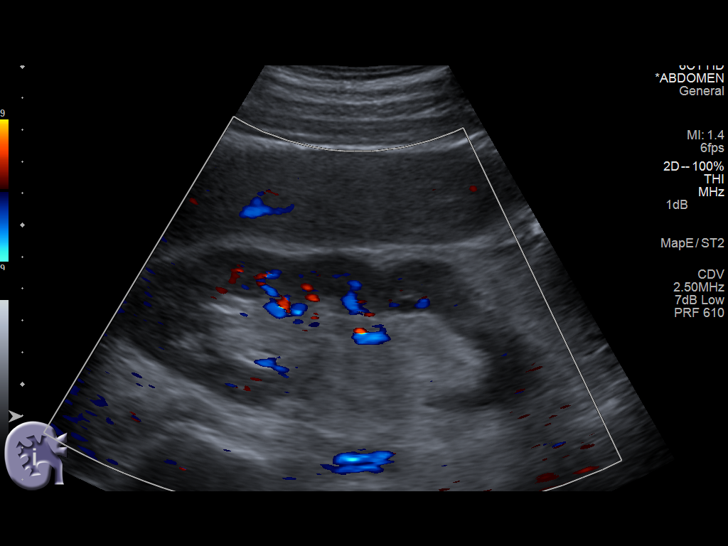
[im 77/115]
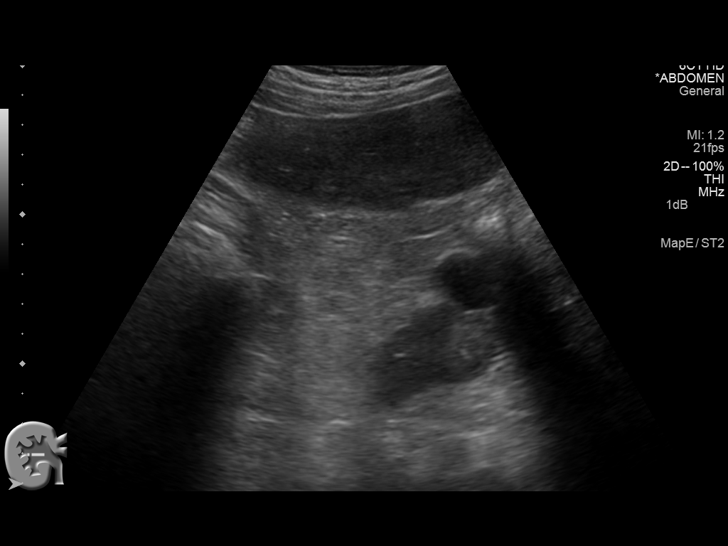
[im 86/115]
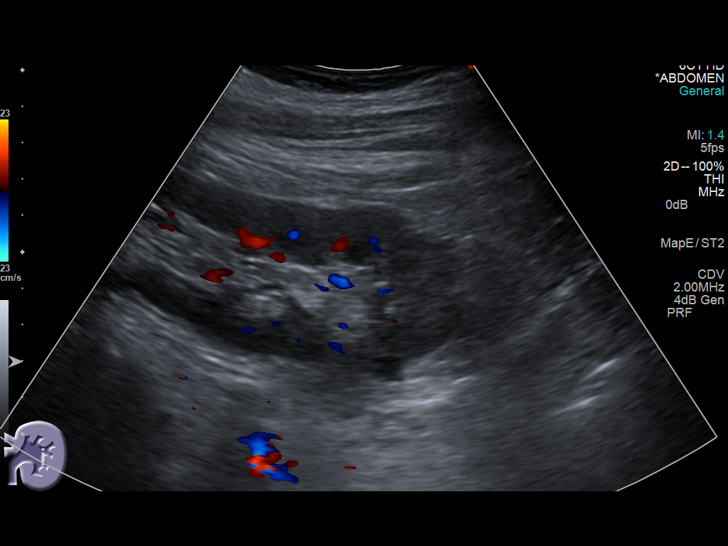
[im 96/115]
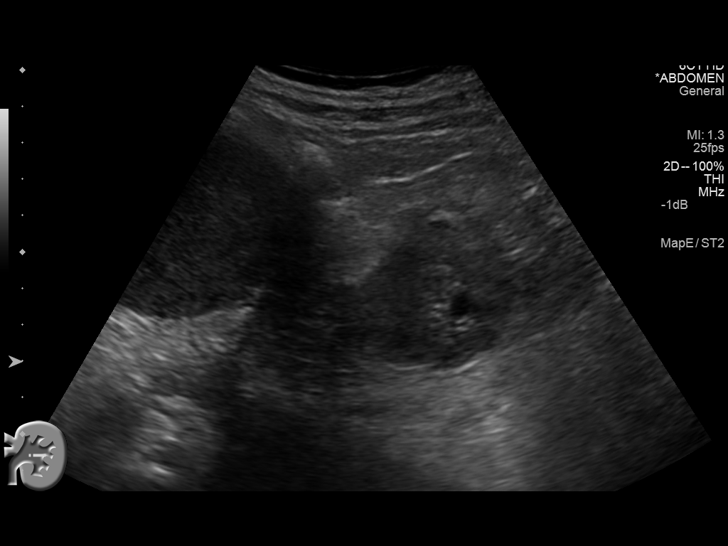
[im 105/115]
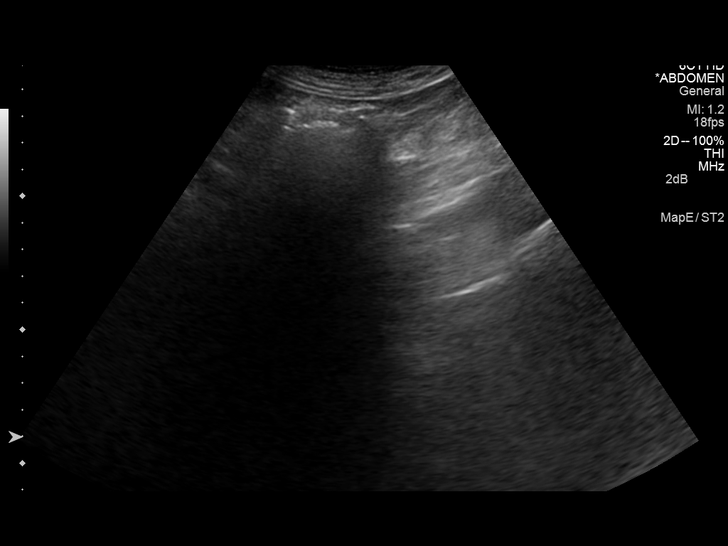
[im 115/115]
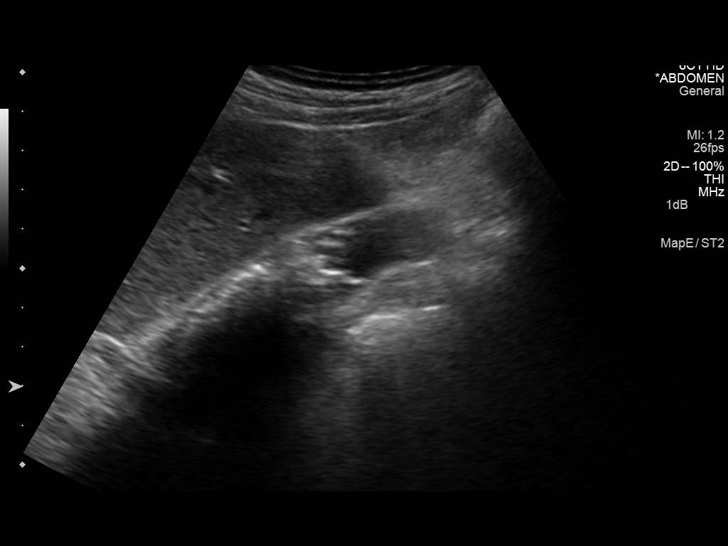

[13 of 25 positions shown; findings below may reference images not displayed]

FINDINGS: Gallbladder: Multiple shadowing echogenic stones are demonstrated
within the gallbladder lumen. No gallbladder wall thickening.
Negative sonographic Murphy sign.

Common bile duct: Diameter: 4 mm

Liver: No definite focal lesion identified. There is a region of
sonographic heterogeneity within the right hepatic lobe measuring up
to 2.8 cm and likely corresponding with the known right hepatic
hemangioma.

IVC: Intrahepatic IVC not visualized.

Pancreas: Poorly visualized.

Spleen: Enlarged measuring up to 18 cm.

Right Kidney: Length: 12.8 cm. No hydronephrosis. Multiple
hypoechoic lesions demonstrated, the largest of which are compatible
with cysts.

Left Kidney: Length: 14.8 cm. No hydronephrosis. Multiple hypoechoic
lesions demonstrated, the largest of which are compatible with
cysts.

Abdominal aorta: Distal aorta not visualized.

Other findings: None.
IMPRESSION: Cholelithiasis without sonographic evidence for acute cholecystitis.
Normal diameter common bile duct.

Splenomegaly.

Multiple bilateral hypoechoic renal lesions, largest of which are
compatible with cysts. The smaller lesions are not able to be
characterized on current evaluation.
